# Patient Record
Sex: Female | Born: 1990 | Race: Black or African American | Hispanic: No | Marital: Single | Smoking: Current some day smoker
Health system: Southern US, Community
[De-identification: ages and names within clinical notes are randomized; demographics above are authoritative.]

## PROBLEM LIST (undated history)

## (undated) DIAGNOSIS — G43909 Migraine, unspecified, not intractable, without status migrainosus: Secondary | ICD-10-CM

## (undated) DIAGNOSIS — J45909 Unspecified asthma, uncomplicated: Secondary | ICD-10-CM

## (undated) DIAGNOSIS — D649 Anemia, unspecified: Secondary | ICD-10-CM

## (undated) DIAGNOSIS — E669 Obesity, unspecified: Secondary | ICD-10-CM

## (undated) DIAGNOSIS — J42 Unspecified chronic bronchitis: Secondary | ICD-10-CM

## (undated) DIAGNOSIS — S62309A Unspecified fracture of unspecified metacarpal bone, initial encounter for closed fracture: Secondary | ICD-10-CM

## (undated) DIAGNOSIS — R569 Unspecified convulsions: Secondary | ICD-10-CM

---
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
## 2012-01-01 ENCOUNTER — Telehealth (INDEPENDENT_AMBULATORY_CARE_PROVIDER_SITE_OTHER): Payer: Self-pay | Admitting: General Surgery

## 2012-01-01 NOTE — Telephone Encounter (Signed)
Received call from Jola Babinski, at [facility], to schedule this pt for Urgent clinic.  Gave appt for next afternoon for Rt abscess vs cyst, possible I&D.  Jola Babinski called back to report the mother of this pt cannot come to any appt before next week.  Called the mother to impress importance of coming to appt tomorrow because of pain and infection for her daughter.  Mother explained she has to arrange transportation through her Medicaid, and they require 5 days advance notice.  Urgent office appt canceled and pt was rescheduled as a new patient appt with Dr. Donell Beers.  Mother aware of appt date, time and location.  Also advised mother to take pt to ER if she develops fever > 101 F or severe pain.  Mother understands all.

## 2012-01-02 ENCOUNTER — Encounter (INDEPENDENT_AMBULATORY_CARE_PROVIDER_SITE_OTHER): Payer: Self-pay | Admitting: Surgery

## 2012-01-08 ENCOUNTER — Ambulatory Visit (INDEPENDENT_AMBULATORY_CARE_PROVIDER_SITE_OTHER): Payer: Self-pay | Admitting: General Surgery

## 2012-01-10 ENCOUNTER — Ambulatory Visit (INDEPENDENT_AMBULATORY_CARE_PROVIDER_SITE_OTHER): Payer: Self-pay | Admitting: General Surgery

## 2012-01-10 ENCOUNTER — Ambulatory Visit (INDEPENDENT_AMBULATORY_CARE_PROVIDER_SITE_OTHER): Payer: Medicaid Other | Admitting: General Surgery

## 2012-01-10 ENCOUNTER — Encounter (INDEPENDENT_AMBULATORY_CARE_PROVIDER_SITE_OTHER): Payer: Self-pay | Admitting: General Surgery

## 2012-01-10 VITALS — BP 102/60 | HR 100 | Temp 98.4°F | Resp 20 | Ht 66.0 in | Wt 186.6 lb

## 2012-01-10 DIAGNOSIS — L720 Epidermal cyst: Secondary | ICD-10-CM | POA: Insufficient documentation

## 2012-01-10 DIAGNOSIS — L723 Sebaceous cyst: Secondary | ICD-10-CM

## 2012-01-10 HISTORY — DX: Epidermal cyst: L72.0

## 2012-01-10 NOTE — Assessment & Plan Note (Addendum)
Patient appears to have an epidermal inclusion cyst of the chest wall at the medial right inframammary fold.  She has only noted this for around a month.  I will see her back in 2 months.  If it is still bothering her, we will schedule to excise this. I advised her that if it does get red or is increasing in symptoms, that she can call and leave a message and we will find a time earlier for excision. This is only around 8 mm and is amenable to excision in the office.

## 2012-01-10 NOTE — Progress Notes (Signed)
Chief complaint:  Breast mass vs cyst on right  HISTORY: Patient is a 21 year old female who has noticed a small lump in her right breast for around a month. She states that it has gotten bigger and smaller multiple times. She has not correlated this with her menstrual cycle. She does have regular periods.  The area is sometimes sore especially if it is bumped. However, she has not had enough discomfort from it to require analgesics. She has not noticed any skin redness or drainage. She denies fevers and chills.She does have a family history of breast cancer in 2 maternal great aunts.  History reviewed. No pertinent past medical history.  History reviewed. No pertinent past surgical history.  No current outpatient prescriptions on file.     Allergies  Allergen Reactions  . Latex Hives     Family History  Problem Relation Age of Onset  . Cancer Maternal Aunt     breast  . Cancer Paternal Aunt     lung  . Cancer Maternal Aunt     breast  . Cancer Maternal Aunt     ovarian  . Cancer Maternal Aunt     ovarian     History   Social History  . Marital Status: Single    Spouse Name: N/A    Number of Children: N/A  . Years of Education: N/A   Social History Main Topics  . Smoking status: Current Some Day Smoker  . Smokeless tobacco: None  . Alcohol Use: Yes  . Drug Use: No  . Sexually Active:     REVIEW OF SYSTEMS - PERTINENT POSITIVES ONLY: 12 point review of systems negative other than HPI and PMH   EXAM: Filed Vitals:   01/10/12 0940  BP: 102/60  Pulse: 100  Temp: 98.4 F (36.9 C)  Resp: 20    Gen:  No acute distress.  Well nourished and well groomed.   Neurological: Alert and oriented to person, place, and time. Coordination normal.  Head: Normocephalic and atraumatic.  Eyes: Conjunctivae are normal. Pupils are equal, round, and reactive to light. No scleral icterus.  Neck: Normal range of motion. Neck supple. No tracheal deviation or thyromegaly present.   Cardiovascular: Normal rate, regular rhythm and intact distal pulses.   Respiratory: Effort normal.  No respiratory distress.  Breasts:  Ptotic breasts are symmetric bilaterally.  There is an 8 mm firm mobile intradermal lesion at the very medial aspect of the right inframammary fold on the right lateral sternal border.  This is slightly tender.  There is no erythema, but there is a small amount of hyperpigmentation nearby.  There is a scar around 1 cm lateral to the area.   GI: Soft. Bowel sounds are normal. The abdomen is soft and nontender.  There is no rebound and no guarding.  Musculoskeletal: Normal range of motion. Extremities are nontender.  Lymphadenopathy: No cervical, preauricular, postauricular or axillary adenopathy is present Skin: Skin is warm and dry. No rash noted. No diaphoresis. No erythema. No pallor. No clubbing, cyanosis, or edema.   Psychiatric: Normal mood and affect. Behavior is normal. Judgment and thought content normal.     ASSESSMENT AND PLAN: Epidermal inclusion cyst Patient appears to have an epidermal inclusion cyst of the chest wall at the medial right inframammary fold.  She has only noted this for around a month.  I will see her back in 2 months.  If it is still bothering her, we will schedule to excise this. I advised  her that if it does get red or is increasing in symptoms, that she can call and leave a message and we will find a time earlier for excision. This is only around 8 mm and is amenable to excision in the office.      Maudry Diego MD Surgical Oncology, General and Endocrine Surgery [facility]      Visit Diagnoses: 1. Epidermal inclusion cyst     Primary Care Physician: Cain Sieve, MD

## 2012-01-10 NOTE — Patient Instructions (Signed)
Follow up in 2 months.  If you decide earlier that you want the epidermal inclusion cyst before then, call and speak with [facility].

## 2012-02-03 ENCOUNTER — Ambulatory Visit (INDEPENDENT_AMBULATORY_CARE_PROVIDER_SITE_OTHER): Payer: Medicaid Other | Admitting: General Surgery

## 2012-02-03 DIAGNOSIS — R222 Localized swelling, mass and lump, trunk: Secondary | ICD-10-CM

## 2012-02-03 NOTE — Patient Instructions (Signed)
No shower for 48 hours.  After showering, may remove outer dressing.  Follow up in 3 weeks.  No bath or swim for 2 weeks.

## 2012-02-04 NOTE — Progress Notes (Signed)
PRE-OPERATIVE DIAGNOSIS: right chest wall mass  POST-OPERATIVE DIAGNOSIS:  Same  PROCEDURE:  Procedure(s): Excision of right chest wall mass  SURGEON:  Surgeon(s): Almond Lint, MD  ANESTHESIA:   local  DRAINS: none   LOCAL MEDICATIONS USED:  XYLOCAINE   SPECIMEN:  Source of Specimen:  right chest wall mass  DISPOSITION OF SPECIMEN:  PATHOLOGY  PLAN OF CARE: Home  Findings:  Small cyst c/w epidermal inclusion cyst.  PROCEDURE:  Pt identified in room.  Mass of concern was confirmed with pt on right inframammary fold medially. Informed consent was obtained.  The area was prepped and draped sterilely.  The skin was anesthetized with 1% lidocaine with epinephrine.  A 1.5 cm incision was made with a #15 blade over the mass.  Sharp dissection was used to dissect the mass away from the skin.  Pressure was held for hemostasis.  Once this was obtained, the incision was closed with 3-0 vicryl deep dermal interrupted sutures and 4-0 monocryl running subcuticular suture.  The wound was cleaned, dried, and dressed with steristrips, gauze, and Tegaderm.  The patient tolerated the procedure well.

## 2012-02-25 ENCOUNTER — Ambulatory Visit (INDEPENDENT_AMBULATORY_CARE_PROVIDER_SITE_OTHER): Payer: Medicaid Other | Admitting: General Surgery

## 2012-02-25 VITALS — BP 108/60 | HR 72 | Temp 98.0°F | Resp 18 | Ht 66.0 in | Wt 190.8 lb

## 2012-02-25 DIAGNOSIS — L723 Sebaceous cyst: Secondary | ICD-10-CM

## 2012-02-25 DIAGNOSIS — L720 Epidermal cyst: Secondary | ICD-10-CM

## 2012-02-25 NOTE — Progress Notes (Signed)
HISTORY: Patient is around 3 weeks status post excision of epidermal inclusion cyst in the right medial inframammary fold. She did have pain for around a week but this has resolved. She denies other drainage or issues.    EXAM: General:  Alert and oriented.  Flat affect Incision:  Firm scar.  No fluctuance.  No erythema.     PATHOLOGY: Soft tissue mass, simple excision, chest wall, right - CHRONICALLY INFLAMED SUBCUTANEOUS TISSUE. - NO DYSPLASIA OR MALIGNANCY IDENTIFIED.   ASSESSMENT AND PLAN:   Epidermal inclusion cyst Pathology benign  Follow up as needed.  No evidence of surgical complications.        Maudry Diego, MD Surgical Oncology, General & Endocrine Surgery [facility]  Cain Sieve, MD Cain Sieve*

## 2012-02-25 NOTE — Assessment & Plan Note (Signed)
Pathology benign  Follow up as needed.  No evidence of surgical complications.

## 2012-02-25 NOTE — Patient Instructions (Signed)
Follow up as needed.  Call if issues occur.

## 2012-03-13 ENCOUNTER — Encounter (INDEPENDENT_AMBULATORY_CARE_PROVIDER_SITE_OTHER): Payer: Medicaid Other | Admitting: General Surgery

## 2012-04-04 ENCOUNTER — Other Ambulatory Visit: Payer: Self-pay

## 2012-09-10 ENCOUNTER — Encounter (INDEPENDENT_AMBULATORY_CARE_PROVIDER_SITE_OTHER): Payer: Self-pay | Admitting: General Surgery

## 2012-09-10 ENCOUNTER — Ambulatory Visit (INDEPENDENT_AMBULATORY_CARE_PROVIDER_SITE_OTHER): Payer: Medicaid Other | Admitting: General Surgery

## 2012-09-10 VITALS — BP 100/77 | HR 62 | Temp 98.1°F | Resp 12 | Ht 67.0 in | Wt 172.8 lb

## 2012-09-10 DIAGNOSIS — N61 Mastitis without abscess: Secondary | ICD-10-CM

## 2012-09-10 DIAGNOSIS — N611 Abscess of the breast and nipple: Secondary | ICD-10-CM

## 2012-09-10 NOTE — Progress Notes (Signed)
Chief Complaint  Patient presents with  . Wound Infection    right breast    HISTORY:  Nicole Rose is a 22 y.o. female who presents to clinic with a R medial breast mass.  This is in the same place as an epidermal inclusion cyst that was removed by Dr Donell Beers in Dec.  She states that it has been increasing in size for the past 2 weeks.  She denies erythema, fevers or chills.  She also states that she has had some spontaneous nipple inversion over the past few weeks.    History reviewed. No pertinent past medical history.     History reviewed. past surgical history: excision of inclusion cyst, R breast 12/13   No current outpatient prescriptions on file.   No current facility-administered medications for this visit.     Allergies  Allergen Reactions  . Latex Hives      Family History  Problem Relation Age of Onset  . Cancer Maternal Aunt     breast  . Cancer Paternal Aunt     lung  . Cancer Maternal Aunt     breast  . Cancer Maternal Aunt     ovarian  . Cancer Maternal Aunt     ovarian      History   Social History  . Marital Status: Single    Spouse Name: N/A    Number of Children: N/A  . Years of Education: N/A   Social History Main Topics  . Smoking status: Current Some Day Smoker -- 0.50 packs/day    Types: Cigarettes  . Smokeless tobacco: None  . Alcohol Use: Yes  . Drug Use: No  . Sexually Active: None   Other Topics Concern  . None   Social History Narrative  . None       REVIEW OF SYSTEMS - PERTINENT POSITIVES ONLY: Review of Systems - General ROS: negative for - chills or fever Respiratory ROS: no cough, shortness of breath, or wheezing Cardiovascular ROS: no chest pain or dyspnea on exertion Gastrointestinal ROS: no abdominal pain, change in bowel habits, or black or bloody stools  EXAM: Filed Vitals:   09/10/12 1536  BP: 100/77  Pulse: 62  Temp: 98.1 F (36.7 C)  Resp: 12    General appearance: alert and cooperative Resp:  clear to auscultation bilaterally Cardio: regular rate and rhythm GI: soft, non-tender; bowel sounds normal; no masses,  no organomegaly R breast: mass noted subcutaneously, area of fluctuance palpated above this  Procedure: I&D Surgeon: Maisie Fus Assistant: Christella Scheuermann After the risks and benefits were explained, verbal consent was obtained for above procedure  Anesthesia: lidocaine subcutaneously Diagnosis: R breast mass, abscess Procedure: The patient was lain supine and the area was cleaned with chloroprep.  Subcutaneous lidocaine was infused around the mass. An incision was made around the fluctuant portion of the mass using an 11 blade scalpel. Purulence was expressed. Direct pressure was applied for hemostasis. A sterile dressing was applied over this. Patient tolerated the procedure well   ASSESSMENT AND PLAN: Nicole Rose is a 22 y.o. F who is s/p removal of an inclusion cyst.  She has developed an abscess in this same area.  This was opened and allowed to drain.  I have asked her to f/u with Dr Donell Beers in a few weeks to see her once the inflammation has receded.      Vanita Panda, MD Colon and Rectal Surgery / General Surgery [facility]  Visit Diagnoses: 1. Breast abscess of female     Primary Care Physician: Cain Sieve, MD

## 2012-09-10 NOTE — Patient Instructions (Signed)
Remove dressing tomorrow.  Clean with soap and water.  Keep area covered until it stops draining then ok to leave open to air.

## 2012-09-22 ENCOUNTER — Encounter (HOSPITAL_COMMUNITY): Payer: Self-pay | Admitting: Pharmacy Technician

## 2012-09-22 ENCOUNTER — Encounter (INDEPENDENT_AMBULATORY_CARE_PROVIDER_SITE_OTHER): Payer: Self-pay | Admitting: General Surgery

## 2012-09-22 ENCOUNTER — Ambulatory Visit (INDEPENDENT_AMBULATORY_CARE_PROVIDER_SITE_OTHER): Payer: Medicaid Other | Admitting: General Surgery

## 2012-09-22 VITALS — BP 118/68 | HR 64 | Resp 14 | Ht 67.0 in | Wt 174.4 lb

## 2012-09-22 DIAGNOSIS — N61 Mastitis without abscess: Secondary | ICD-10-CM

## 2012-09-22 DIAGNOSIS — N611 Abscess of the breast and nipple: Secondary | ICD-10-CM | POA: Insufficient documentation

## 2012-09-22 MED ORDER — DOXYCYCLINE HYCLATE 100 MG PO TABS: 100.0000 mg | ORAL_TABLET | Freq: Two times a day (BID) | ORAL | Status: DC

## 2012-09-22 NOTE — Assessment & Plan Note (Signed)
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   Will schedule removal under anesthesia.   Hopefully we can get this area healed up so it stops hurting and draining. I will place her on doxycycline until the time of surgery to minimize risk of post operative infection.

## 2012-09-22 NOTE — Patient Instructions (Signed)
Do warm compresses and antibiotic ointment.   Take antibiotics for the next week.    Main risks are bleeding, infection, damage to adjacent structures, wound issues.

## 2012-09-22 NOTE — Progress Notes (Signed)
Chief Complaint  Patient presents with  . Routine Post Op    po br cyst    HISTORY:  Nicole Rose is a 22 y.o. female  status post excision of inclusion cyst of the right breast in December of last year. She saw Dr. Thomas several weeks ago with a recurrent infection.  She underwent I&D. She states that it did well for several days but it started to hurt again and get swollen again. She denies fevers and chills. She has not noticed any drainage.  History reviewed. No pertinent past medical history.     History reviewed. past surgical history: excision of inclusion cyst, R breast 12/13   Current Outpatient Prescriptions  Medication Sig Dispense Refill  . doxycycline (VIBRA-TABS) 100 MG tablet Take 1 tablet (100 mg total) by mouth 2 (two) times daily.  14 tablet  2  . ibuprofen (ADVIL,MOTRIN) 600 MG tablet Take 600 mg by mouth every 6 (six) hours as needed for pain. For pain       No current facility-administered medications for this visit.     Allergies  Allergen Reactions  . Latex Hives      Family History  Problem Relation Age of Onset  . Cancer Maternal Aunt     breast  . Cancer Paternal Aunt     lung  . Cancer Maternal Aunt     breast  . Cancer Maternal Aunt     ovarian  . Cancer Maternal Aunt     ovarian      History   Social History  . Marital Status: Single    Spouse Name: N/A    Number of Children: N/A  . Years of Education: N/A   Social History Main Topics  . Smoking status: Current Some Day Smoker -- 0.50 packs/day    Types: Cigarettes  . Smokeless tobacco: None  . Alcohol Use: Yes  . Drug Use: No  . Sexually Active: None    REVIEW OF SYSTEMS - PERTINENT POSITIVES ONLY: Review of Systems - otherwise negative x 11.    EXAM: Filed Vitals:   09/22/12 1554  BP: 118/68  Pulse: 64  Resp: 14    General appearance: alert and cooperative GI: soft, non-tender; bowel sounds normal; no masses,  no organomegaly R breast: area of thickening right  medial breast.  Small amount of purulent drainage expressed at inframammary fold.    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   Will schedule removal under anesthesia.   Hopefully we can get this area healed up so it stops hurting and draining. I will place her on doxycycline until the time of surgery to minimize risk of post operative infection.          Visit Diagnoses: 1. Chronic abscess of breast     Primary Care Physician: PERRY, MARTHA FAIRBANKS, MD     

## 2012-09-25 ENCOUNTER — Encounter (HOSPITAL_COMMUNITY)
Admission: RE | Admit: 2012-09-25 | Discharge: 2012-09-25 | Disposition: A | Discharge status: Home / Self Care | Payer: Medicaid Other | Source: Ambulatory Visit | Attending: General Surgery | Admitting: General Surgery

## 2012-09-25 ENCOUNTER — Encounter (HOSPITAL_COMMUNITY): Payer: Self-pay

## 2012-09-25 LAB — CBC
Platelets: 255 10*3/uL (ref 150–400)
RDW: 14.5 % (ref 11.5–15.5)
WBC: 10.1 10*3/uL (ref 4.0–10.5)

## 2012-09-25 LAB — HCG, SERUM, QUALITATIVE: Preg, Serum: NEGATIVE

## 2012-09-25 NOTE — Pre-Procedure Instructions (Signed)
PORCIA MORGANTI  09/25/2012   Your procedure is scheduled on:  Monday September 28, 2012  Report to [facility] at 0530 AM.  Call this number if you have problems the morning of surgery: [phone]   Remember:   Do not eat food or drink liquids after midnight.   Take these medicines the morning of surgery with A SIP OF WATER: None.              Stop Ibuprofen (ADVIL, MOTRIN)  7 days prior surgery.   Do not wear jewelry, make-up or nail polish.  Do not wear lotions, powders, or perfumes. You may wear deodorant.  Do not shave 48 hours prior to surgery.   Do not bring valuables to the hospital.  [facility] is not responsible for any belongings or valuables.  Contacts, dentures or bridgework may not be worn into surgery.  Leave suitcase in the car. After surgery it may be brought to your room.  For patients admitted to the hospital, checkout time is 11:00 AM the day of discharge.   Patients discharged the day of surgery will not be allowed to drive home.  Name and phone number of your driver:   Special Instructions: Shower using CHG 2 nights before surgery and the night before surgery.  If you shower the day of surgery use CHG.  Use special wash - you have one bottle of CHG for all showers.  You should use approximately 1/3 of the bottle for each shower.   Please read over the following fact sheets that you were given: Pain Booklet, Blood Transfusion Information and Surgical Site Infection Prevention

## 2012-09-27 MED ORDER — CEFAZOLIN SODIUM-DEXTROSE 2-3 GM-% IV SOLR
2.0000 g | INTRAVENOUS | Status: AC
  Administered 2012-09-28: 2 g via INTRAVENOUS
  Filled 2012-09-27: qty 50

## 2012-09-28 ENCOUNTER — Ambulatory Visit (HOSPITAL_COMMUNITY)
Admission: RE | Admit: 2012-09-28 | Discharge: 2012-09-28 | Disposition: A | Discharge status: Home / Self Care | Payer: Medicaid Other | Source: Ambulatory Visit | Attending: General Surgery | Admitting: General Surgery

## 2012-09-28 ENCOUNTER — Encounter (HOSPITAL_COMMUNITY)
Admission: RE | Disposition: A | Discharge status: Home / Self Care | Payer: Self-pay | Source: Ambulatory Visit | Attending: General Surgery

## 2012-09-28 ENCOUNTER — Ambulatory Visit (HOSPITAL_COMMUNITY): Payer: Medicaid Other | Admitting: Anesthesiology

## 2012-09-28 ENCOUNTER — Encounter (HOSPITAL_COMMUNITY): Payer: Self-pay | Admitting: Anesthesiology

## 2012-09-28 ENCOUNTER — Encounter (HOSPITAL_COMMUNITY): Payer: Self-pay | Admitting: *Deleted

## 2012-09-28 DIAGNOSIS — N6459 Other signs and symptoms in breast: Secondary | ICD-10-CM | POA: Insufficient documentation

## 2012-09-28 DIAGNOSIS — N61 Mastitis without abscess: Secondary | ICD-10-CM | POA: Insufficient documentation

## 2012-09-28 HISTORY — PX: BREAST BIOPSY: SHX20

## 2012-09-28 SURGERY — BREAST BIOPSY
Anesthesia: General | Site: Breast | Laterality: Right | Wound class: Contaminated

## 2012-09-28 MED ORDER — BUPIVACAINE-EPINEPHRINE PF 0.25-1:200000 % IJ SOLN
INTRAMUSCULAR | Status: AC
  Filled 2012-09-28: qty 30

## 2012-09-28 MED ORDER — HYDROMORPHONE HCL PF 1 MG/ML IJ SOLN: 0.2500 mg | INTRAMUSCULAR | Status: DC | PRN

## 2012-09-28 MED ORDER — ONDANSETRON HCL 4 MG/2ML IJ SOLN
4.0000 mg | Freq: Four times a day (QID) | INTRAMUSCULAR | Status: DC | PRN
  Filled 2012-09-28: qty 2

## 2012-09-28 MED ORDER — FENTANYL CITRATE 0.05 MG/ML IJ SOLN
INTRAMUSCULAR | Status: DC | PRN
  Administered 2012-09-28 (×3): 50 ug via INTRAVENOUS
  Administered 2012-09-28: 100 ug via INTRAVENOUS

## 2012-09-28 MED ORDER — OXYCODONE HCL 5 MG PO TABS: 5.0000 mg | ORAL_TABLET | ORAL | Status: DC | PRN

## 2012-09-28 MED ORDER — LACTATED RINGERS IV SOLN
INTRAVENOUS | Status: DC | PRN
  Administered 2012-09-28: 07:00:00 via INTRAVENOUS

## 2012-09-28 MED ORDER — OXYCODONE-ACETAMINOPHEN 5-325 MG PO TABS: 1.0000 | ORAL_TABLET | ORAL | Status: DC | PRN

## 2012-09-28 MED ORDER — BUPIVACAINE-EPINEPHRINE PF 0.25-1:200000 % IJ SOLN
INTRAMUSCULAR | Status: DC | PRN
  Administered 2012-09-28: 30 mL

## 2012-09-28 MED ORDER — MIDAZOLAM HCL 5 MG/5ML IJ SOLN
INTRAMUSCULAR | Status: DC | PRN
  Administered 2012-09-28: 2 mg via INTRAVENOUS

## 2012-09-28 MED ORDER — ONDANSETRON HCL 4 MG/2ML IJ SOLN
INTRAMUSCULAR | Status: DC | PRN
  Administered 2012-09-28: 4 mg via INTRAVENOUS

## 2012-09-28 MED ORDER — DEXAMETHASONE SODIUM PHOSPHATE 4 MG/ML IJ SOLN
INTRAMUSCULAR | Status: DC | PRN
  Administered 2012-09-28: 8 mg via INTRAVENOUS

## 2012-09-28 MED ORDER — ONDANSETRON HCL 4 MG/2ML IJ SOLN: 4.0000 mg | Freq: Once | INTRAMUSCULAR | Status: DC | PRN

## 2012-09-28 MED ORDER — LIDOCAINE HCL (CARDIAC) 10 MG/ML IV SOLN
INTRAVENOUS | Status: DC | PRN
  Administered 2012-09-28: 70 mg via INTRAVENOUS

## 2012-09-28 MED ORDER — PROPOFOL 10 MG/ML IV BOLUS
INTRAVENOUS | Status: DC | PRN
  Administered 2012-09-28: 160 mg via INTRAVENOUS

## 2012-09-28 MED ORDER — 0.9 % SODIUM CHLORIDE (POUR BTL) OPTIME
TOPICAL | Status: DC | PRN
  Administered 2012-09-28: 1000 mL

## 2012-09-28 MED ORDER — LIDOCAINE HCL (PF) 1 % IJ SOLN
INTRAMUSCULAR | Status: AC
  Filled 2012-09-28: qty 30

## 2012-09-28 SURGICAL SUPPLY — 46 items
BENZOIN TINCTURE PRP APPL 2/3 (GAUZE/BANDAGES/DRESSINGS) ×2 IMPLANT
BINDER BREAST LRG (GAUZE/BANDAGES/DRESSINGS) IMPLANT
BINDER BREAST MEDIUM (GAUZE/BANDAGES/DRESSINGS) ×2 IMPLANT
BINDER BREAST XLRG (GAUZE/BANDAGES/DRESSINGS) IMPLANT
BLADE SURG 15 STRL LF DISP TIS (BLADE) ×1 IMPLANT
BLADE SURG 15 STRL SS (BLADE) ×1
CANISTER SUCTION 2500CC (MISCELLANEOUS) ×2 IMPLANT
CHLORAPREP W/TINT 26ML (MISCELLANEOUS) ×2 IMPLANT
CLOTH BEACON ORANGE TIMEOUT ST (SAFETY) ×2 IMPLANT
CONT SPEC 4OZ CLIKSEAL STRL BL (MISCELLANEOUS) ×2 IMPLANT
COVER SURGICAL LIGHT HANDLE (MISCELLANEOUS) ×2 IMPLANT
DECANTER SPIKE VIAL GLASS SM (MISCELLANEOUS) IMPLANT
DEVICE DUBIN SPECIMEN MAMMOGRA (MISCELLANEOUS) IMPLANT
DRAPE PED LAPAROTOMY (DRAPES) ×2 IMPLANT
DRAPE UTILITY 15X26 W/TAPE STR (DRAPE) ×4 IMPLANT
ELECT CAUTERY BLADE 6.4 (BLADE) ×2 IMPLANT
ELECT REM PT RETURN 9FT ADLT (ELECTROSURGICAL) ×2
ELECTRODE REM PT RTRN 9FT ADLT (ELECTROSURGICAL) ×1 IMPLANT
GLOVE BIO SURGEON STRL SZ 6 (GLOVE) IMPLANT
GLOVE BIOGEL PI IND STRL 6.5 (GLOVE) ×1 IMPLANT
GLOVE BIOGEL PI INDICATOR 6.5 (GLOVE) ×1
GLOVE SURG SS PI 6.0 STRL IVOR (GLOVE) ×4 IMPLANT
GOWN PREVENTION PLUS XXLARGE (GOWN DISPOSABLE) ×2 IMPLANT
GOWN STRL NON-REIN LRG LVL3 (GOWN DISPOSABLE) ×4 IMPLANT
KIT BASIN OR (CUSTOM PROCEDURE TRAY) ×2 IMPLANT
KIT MARKER MARGIN INK (KITS) IMPLANT
KIT ROOM TURNOVER OR (KITS) ×2 IMPLANT
NEEDLE HYPO 25GX1X1/2 BEV (NEEDLE) ×2 IMPLANT
NS IRRIG 1000ML POUR BTL (IV SOLUTION) ×2 IMPLANT
PACK SURGICAL SETUP 50X90 (CUSTOM PROCEDURE TRAY) ×2 IMPLANT
PAD ARMBOARD 7.5X6 YLW CONV (MISCELLANEOUS) ×4 IMPLANT
PENCIL BUTTON HOLSTER BLD 10FT (ELECTRODE) ×2 IMPLANT
SPONGE GAUZE 4X4 12PLY (GAUZE/BANDAGES/DRESSINGS) ×2 IMPLANT
SPONGE LAP 18X18 X RAY DECT (DISPOSABLE) ×2 IMPLANT
STRIP CLOSURE SKIN 1/2X4 (GAUZE/BANDAGES/DRESSINGS) ×2 IMPLANT
SUT MON AB 4-0 PC3 18 (SUTURE) ×2 IMPLANT
SUT SILK 2 0 FS (SUTURE) IMPLANT
SUT VIC AB 3-0 SH 27 (SUTURE) ×3
SUT VIC AB 3-0 SH 27X BRD (SUTURE) ×3 IMPLANT
SYR BULB 3OZ (MISCELLANEOUS) ×2 IMPLANT
SYR CONTROL 10ML LL (SYRINGE) ×2 IMPLANT
TOWEL OR 17X24 6PK STRL BLUE (TOWEL DISPOSABLE) ×2 IMPLANT
TOWEL OR 17X26 10 PK STRL BLUE (TOWEL DISPOSABLE) ×2 IMPLANT
TUBE CONNECTING 12X1/4 (SUCTIONS) ×2 IMPLANT
WATER STERILE IRR 1000ML POUR (IV SOLUTION) IMPLANT
YANKAUER SUCT BULB TIP NO VENT (SUCTIONS) ×2 IMPLANT

## 2012-09-28 NOTE — Anesthesia Preprocedure Evaluation (Signed)
Anesthesia Evaluation  Patient identified by MRN, date of birth, ID band Patient awake    Reviewed: Allergy & Precautions, H&P , NPO status , Patient's Chart, lab work & pertinent test results  Airway       Dental   Pulmonary          Cardiovascular     Neuro/Psych  Headaches,    GI/Hepatic   Endo/Other    Renal/GU      Musculoskeletal   Abdominal   Peds  Hematology   Anesthesia Other Findings   Reproductive/Obstetrics                           Anesthesia Physical Anesthesia Plan  ASA: I  Anesthesia Plan: General   Post-op Pain Management:    Induction: Intravenous  Airway Management Planned: LMA and Oral ETT  Additional Equipment:   Intra-op Plan:   Post-operative Plan: Extubation in OR  Informed Consent:   Plan Discussed with: CRNA, Anesthesiologist and Surgeon  Anesthesia Plan Comments:         Anesthesia Quick Evaluation

## 2012-09-28 NOTE — H&P (View-Only) (Signed)
Chief Complaint  Patient presents with  . Routine Post Op    po br cyst    HISTORY:  Nicole Rose is a 22 y.o. female  status post excision of inclusion cyst of the right breast in December of last year. She saw Dr. Maisie Fus several weeks ago with a recurrent infection.  She underwent I&D. She states that it did well for several days but it started to hurt again and get swollen again. She denies fevers and chills. She has not noticed any drainage.  History reviewed. No pertinent past medical history.     History reviewed. past surgical history: excision of inclusion cyst, R breast 12/13   Current Outpatient Prescriptions  Medication Sig Dispense Refill  . doxycycline (VIBRA-TABS) 100 MG tablet Take 1 tablet (100 mg total) by mouth 2 (two) times daily.  14 tablet  2  . ibuprofen (ADVIL,MOTRIN) 600 MG tablet Take 600 mg by mouth every 6 (six) hours as needed for pain. For pain       No current facility-administered medications for this visit.     Allergies  Allergen Reactions  . Latex Hives      Family History  Problem Relation Age of Onset  . Cancer Maternal Aunt     breast  . Cancer Paternal Aunt     lung  . Cancer Maternal Aunt     breast  . Cancer Maternal Aunt     ovarian  . Cancer Maternal Aunt     ovarian      History   Social History  . Marital Status: Single    Spouse Name: N/A    Number of Children: N/A  . Years of Education: N/A   Social History Main Topics  . Smoking status: Current Some Day Smoker -- 0.50 packs/day    Types: Cigarettes  . Smokeless tobacco: None  . Alcohol Use: Yes  . Drug Use: No  . Sexually Active: None    REVIEW OF SYSTEMS - PERTINENT POSITIVES ONLY: Review of Systems - otherwise negative x 11.    EXAM: Filed Vitals:   09/22/12 1554  BP: 118/68  Pulse: 64  Resp: 14    General appearance: alert and cooperative GI: soft, non-tender; bowel sounds normal; no masses,  no organomegaly R breast: area of thickening right  medial breast.  Small amount of purulent drainage expressed at inframammary fold.    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   Will schedule removal under anesthesia.   Hopefully we can get this area healed up so it stops hurting and draining. I will place her on doxycycline until the time of surgery to minimize risk of post operative infection.          Visit Diagnoses: 1. Chronic abscess of breast     Primary Care Physician: Cain Sieve, MD

## 2012-09-28 NOTE — Interval H&P Note (Signed)
History and Physical Interval Note:  09/28/2012 7:23 AM  Nicole Rose  has presented today for surgery, with the diagnosis of chronic right breast abscess  The various methods of treatment have been discussed with the patient and family. After consideration of risks, benefits and other options for treatment, the patient has consented to  Procedure(s): EXCISION OF CHRONIC RIGHT BREAST ABSCESS (Right) as a surgical intervention .  The patient's history has been reviewed, patient examined, no change in status, stable for surgery.  I have reviewed the patient's chart and labs.  Questions were answered to the patient's satisfaction.     [name]

## 2012-09-28 NOTE — Op Note (Signed)
PRE-OPERATIVE DIAGNOSIS: chronic right breast abscess  POST-OPERATIVE DIAGNOSIS:  Same  PROCEDURE:  Procedure(s): Excision of chronic right breast abscess  SURGEON:  Surgeon(s): Almond Lint, MD  ASSISTANT:   Myrtie Soman, RNFA  ANESTHESIA:   local and general  DRAINS: none   LOCAL MEDICATIONS USED:  MARCAINE     SPECIMEN:  Source of Specimen:  chronic right breast abscess  DISPOSITION OF SPECIMEN:  PATHOLOGY  COUNTS:  YES  DICTATION: .Dragon Dictation  PLAN OF CARE: Discharge to home after PACU  PATIENT DISPOSITION:  PACU - hemodynamically stable.  FINDINGS:  Chronic induration, no active purulent drainage, pits in the inframammary fold  EBL:  min  PROCEDURE: Pt was identified in the holding area, then taken to the OR.  She was placed supine on the operating room table.  General LMA anesthesia was induced.  Her right breast and chest was prepped and draped in sterile fashion.  Timeout was performed according to the surgical safety checklist. When all was correct, we continued.  The area of chronic drainage and the sinus tracts in the inframammary fold were incorporated into one ellipse of skin along the inferiomedial aspect of the right breast.  The incision was made with a #15 blade. Cautery was used to dissect out the chronically inflamed breast tissue.  The surrounding tissue was mobilized in order to pull together the tissue better. The wound was irrigated copiously. A deep layer of 3-0 Vicryl interrupted sutures was placed to recreate the contour of the breast. The redundant skin was resected at the medial aspect of the wound.  The skin was reapproximated with interrupted 3-0 Vicryl deep dermal sutures. This incision was at the inframammary fold. The skin was then closed with 4-0 Monocryl in running subcuticular fashion.  The wound was cleaned, dried, and dressed with Steri-Strips. Marcaine with epinephrine was administered along the surrounding tissue of the wound.  The patient was then placed and a breast binder with gauze and ABDs over the incision. The patient was allowed to emerge from anesthesia and taken to the PACU in stable condition.  Needle, sponge, and instrument counts were correct x2.

## 2012-09-28 NOTE — Anesthesia Postprocedure Evaluation (Signed)
  Anesthesia Post-op Note  Patient: Nicole Rose  Procedure(s) Performed: Procedure(s): EXCISION OF CHRONIC RIGHT BREAST ABSCESS (Right)  Patient Location: PACU  Anesthesia Type:General  Level of Consciousness: awake, alert , oriented and patient cooperative  Airway and Oxygen Therapy: Patient Spontanous Breathing  Post-op Pain: mild  Post-op Assessment: Post-op Vital signs reviewed, Patient's Cardiovascular Status Stable, Respiratory Function Stable, Patent Airway, No signs of Nausea or vomiting and Pain level controlled  Post-op Vital Signs: stable  Complications: No apparent anesthesia complications

## 2012-09-28 NOTE — Transfer of Care (Signed)
Immediate Anesthesia Transfer of Care Note  Patient: Nicole Rose  Procedure(s) Performed: Procedure(s): EXCISION OF CHRONIC RIGHT BREAST ABSCESS (Right)  Patient Location: PACU  Anesthesia Type:General  Level of Consciousness: awake, alert , oriented and patient cooperative  Airway & Oxygen Therapy: Patient Spontanous Breathing  Post-op Assessment: Report given to PACU RN, Post -op Vital signs reviewed and stable and Patient moving all extremities  Post vital signs: Reviewed and stable  Complications: No apparent anesthesia complications

## 2012-09-29 ENCOUNTER — Encounter (HOSPITAL_COMMUNITY): Payer: Self-pay | Admitting: General Surgery

## 2012-09-29 ENCOUNTER — Telehealth (INDEPENDENT_AMBULATORY_CARE_PROVIDER_SITE_OTHER): Payer: Self-pay

## 2012-09-29 NOTE — Telephone Encounter (Signed)
Pt's mother immediately called back and was given pathology results.

## 2012-09-29 NOTE — Telephone Encounter (Signed)
Two failed attempts to reach patient and her mother.  Pathology results show benign specimen.

## 2012-09-29 NOTE — Progress Notes (Signed)
Quick Note:  Please let patient know that there is no evidence of cancer in breast specimen. ______

## 2012-10-09 ENCOUNTER — Encounter (INDEPENDENT_AMBULATORY_CARE_PROVIDER_SITE_OTHER): Payer: Self-pay | Admitting: General Surgery

## 2012-10-09 ENCOUNTER — Ambulatory Visit (INDEPENDENT_AMBULATORY_CARE_PROVIDER_SITE_OTHER): Payer: Medicaid Other | Admitting: General Surgery

## 2012-10-09 VITALS — BP 125/76 | HR 62 | Temp 97.4°F | Resp 14 | Ht 67.0 in | Wt 174.2 lb

## 2012-10-09 DIAGNOSIS — N611 Abscess of the breast and nipple: Secondary | ICD-10-CM

## 2012-10-09 DIAGNOSIS — N61 Mastitis without abscess: Secondary | ICD-10-CM

## 2012-10-09 NOTE — Assessment & Plan Note (Addendum)
No current evidence of wound dehiscence or infection. However, given previous infection, pt at high risk of infected surgical site.  Follow up in 2 weeks to reevaluate.

## 2012-10-09 NOTE — Progress Notes (Signed)
HISTORY: Pt is now around 2 weeks s/p excision of chronic right breast abscess.  She still is having a fair amount of discomfort.  She was concerned about the red edges of the wound.  She denies fevers or chills.    EXAM: General:  Alert and oriented.   Incision:  Ridge of scar at medial right inframammary fold.  No erythema, warmth, or drainage seen.     PATHOLOGY: Breast, excision, Right - SKIN WITH ULCERATION AND DENSE DERMAL AND SUBCUTANEOUS INFLAMMATION, CONSISTENT WITH CLINICALLY STATED ABSCESS. - THERE IS NO EVIDENCE OF MALIGNANCY.   ASSESSMENT AND PLAN:   Chronic abscess of breast No current evidence of wound dehiscence or infection. However, given previous infection, pt at high risk of infected surgical site.  Follow up in 2 weeks to reevaluate.       Maudry Diego, MD Surgical Oncology, General & Endocrine Surgery [facility]  PERRY, Bosie Clos, MD No ref. provider found

## 2012-10-09 NOTE — Patient Instructions (Signed)
Follow-up in 2 weeks

## 2012-10-23 ENCOUNTER — Ambulatory Visit (INDEPENDENT_AMBULATORY_CARE_PROVIDER_SITE_OTHER): Payer: Medicaid Other | Admitting: Pediatrics

## 2012-10-23 ENCOUNTER — Ambulatory Visit (INDEPENDENT_AMBULATORY_CARE_PROVIDER_SITE_OTHER): Payer: Medicaid Other | Admitting: Clinical

## 2012-10-23 ENCOUNTER — Other Ambulatory Visit (HOSPITAL_COMMUNITY)
Admission: RE | Admit: 2012-10-23 | Discharge: 2012-10-23 | Disposition: A | Discharge status: Home / Self Care | Payer: Medicaid Other | Source: Ambulatory Visit | Attending: Pediatrics | Admitting: Pediatrics

## 2012-10-23 ENCOUNTER — Encounter: Payer: Self-pay | Admitting: Pediatrics

## 2012-10-23 VITALS — BP 106/70 | HR 80 | Ht 66.93 in | Wt 176.2 lb

## 2012-10-23 DIAGNOSIS — Z00129 Encounter for routine child health examination without abnormal findings: Secondary | ICD-10-CM

## 2012-10-23 DIAGNOSIS — H579 Unspecified disorder of eye and adnexa: Secondary | ICD-10-CM

## 2012-10-23 DIAGNOSIS — Z0101 Encounter for examination of eyes and vision with abnormal findings: Secondary | ICD-10-CM

## 2012-10-23 DIAGNOSIS — R94120 Abnormal auditory function study: Secondary | ICD-10-CM

## 2012-10-23 DIAGNOSIS — Z68.41 Body mass index (BMI) pediatric, 5th percentile to less than 85th percentile for age: Secondary | ICD-10-CM

## 2012-10-23 DIAGNOSIS — F17200 Nicotine dependence, unspecified, uncomplicated: Secondary | ICD-10-CM

## 2012-10-23 DIAGNOSIS — R42 Dizziness and giddiness: Secondary | ICD-10-CM

## 2012-10-23 DIAGNOSIS — R69 Illness, unspecified: Secondary | ICD-10-CM

## 2012-10-23 DIAGNOSIS — Z3009 Encounter for other general counseling and advice on contraception: Secondary | ICD-10-CM

## 2012-10-23 DIAGNOSIS — F4323 Adjustment disorder with mixed anxiety and depressed mood: Secondary | ICD-10-CM

## 2012-10-23 DIAGNOSIS — G479 Sleep disorder, unspecified: Secondary | ICD-10-CM

## 2012-10-23 DIAGNOSIS — Z113 Encounter for screening for infections with a predominantly sexual mode of transmission: Secondary | ICD-10-CM | POA: Insufficient documentation

## 2012-10-23 NOTE — Progress Notes (Signed)
Referring Provider: Dr. Gwyneth Sprout & Dr. Marina Goodell Length of visit: 4:15pm-4:45pm (30 min)  PRESENTING CONCERNS:  Nicole Rose presented for her well child check.  During the visit, Nicole Rose reported feelings of sadness during the week.  Nicole Rose also reported alcohol, drug & cigarette use during the weekends.  GOALS:  Enhance positive coping skills to minimize depressive symptoms.  INTERVENTIONS:  LCSW built rapport with Nicole Rose and assessed current symptoms for depression.  LCSW also assessed her motivation to change regarding her alcohol & drug use as well as provide education on how her behaviors affect her mood.  LCSW assessed current support system & positive coping skills she is utilizing.  OUTCOME:  Nicole Rose presented to be quiet at first but did share that she does get sad during the week when she thinks of what she's "been throughMcGraw-Hill reported she did not want to talk about what she's been through.  Nicole Rose denied any suicidal ideations or thoughts of hopelessness.  Nicole Rose did report that she can talk to her mother & best friend about anything and that usually makes her feel better. [name] also reported that she listens to music, goes for a walk, or tries to distract herself when she starts thinking "too much."   [name] reported that she does think her alcohol or drug use has anything to do with her sadness and that she does it to socialize with her friends.  [name] reported she does not think her alcohol, drug or cigarette use is a problem and is not ready to change her behaviors at this time.  [name] reported no other concerns or immediate needs at this time.  PLAN:  This LCSW will be available to provide additional support & resources as needed.  [name] will follow up with PCP as appropriate.

## 2012-10-23 NOTE — Patient Instructions (Addendum)
Healthy Sleep Habits:   Teens need about 9 hours of sleep a night. Younger children need more sleep (10-11 hours a night) and adults need slightly less (7-9 hours each night). 11 Tips to Follow: 1. No caffeine after 3pm: Avoid beverages with caffeine (soda, tea, energy drinks, etc.) especially after 3pm.  2. Don't go to bed hungry: Have your evening meal at least 3 hrs. before going to sleep. It's fine to have a small bedtime snack such as a glass of milk and a few crackers but don't have a big meal.  3. Have a nightly routine before bed: Plan on "winding down" before you go to sleep. Begin relaxing about 1 hour before you go to bed. Try doing a quiet activity such as listening to calming music, reading a book or meditating.  4. Turn off the TV and ALL electronics including video games, tablets, laptops, etc. 1 hour before sleep, and keep them out of the bedroom.  5. Turn off your cell phone and all notifications (new email and text alerts) or even better, leave your phone outside your room while you sleep. Studies have shown that a part of your brain continues to respond to certain lights and sounds even while you're still asleep.  6. Make your bedroom quiet, dark and cool. If you can't control the noise, try wearing earplugs or using a fan to block out other sounds.  7. Practice relaxation techniques. Try reading a book or meditating or drain your brain by writing a list of what you need to do the next day.  8. Don't nap unless you feel sick: you'll have a better night's sleep.  9. Don't smoke, or quit if you do. Nicotine, alcohol, and marijuana can all keep you awake. Talk to your health care provider if you need help with substance use.  10. Most importantly, wake up at the same time every day (or within 1 hour of your usual wake up time) EVEN on the weekends. A regular wake up time promotes sleep hygiene and prevents sleep problems.  11. Reduce exposure to bright light in the last three  hours of the day before going to sleep.  Maintaining good sleep hygiene and having good sleep habits lower your risk of developing sleep problems. Getting better sleep can also improve your concentration and alertness. Try the simple steps in this guide. If you still have trouble getting enough rest, make an appointment with your health care provider.    Smoking Cessation Quitting smoking is important to your health and has many advantages. However, it is not always easy to quit since nicotine is a very addictive drug. Often times, people try 3 times or more before being able to quit. This document explains the best ways for you to prepare to quit smoking. Quitting takes hard work and a lot of effort, but you can do it.  Quit Line: 1800-QUIT-NOW ADVANTAGES OF QUITTING SMOKING  You will live longer, feel better, and live better.  Your body will feel the impact of quitting smoking almost immediately.  Within 20 minutes, blood pressure decreases. Your pulse returns to its normal level.  After 8 hours, carbon monoxide levels in the blood return to normal. Your oxygen level increases.  After 24 hours, the chance of having a heart attack starts to decrease. Your breath, hair, and body stop smelling like smoke.  After 48 hours, damaged nerve endings begin to recover. Your sense of taste and smell improve.  After 72 hours, the body  is virtually free of nicotine. Your bronchial tubes relax and breathing becomes easier.  After 2 to 12 weeks, lungs can hold more air. Exercise becomes easier and circulation improves.  The risk of having a heart attack, stroke, cancer, or lung disease is greatly reduced.  After 1 year, the risk of coronary heart disease is cut in half.  After 5 years, the risk of stroke falls to the same as a nonsmoker.  After 10 years, the risk of lung cancer is cut in half and the risk of other cancers decreases significantly.  After 15 years, the risk of coronary heart  disease drops, usually to the level of a nonsmoker.  If you are pregnant, quitting smoking will improve your chances of having a healthy baby.  The people you live with, especially any children, will be healthier.  You will have extra money to spend on things other than cigarettes. QUESTIONS TO THINK ABOUT BEFORE ATTEMPTING TO QUIT You may want to talk about your answers with your caregiver.  Why do you want to quit?  If you tried to quit in the past, what helped and what did not?  What will be the most difficult situations for you after you quit? How will you plan to handle them?  Who can help you through the tough times? Your family? Friends? A caregiver?  What pleasures do you get from smoking? What ways can you still get pleasure if you quit? Here are some questions to ask your caregiver:  How can you help me to be successful at quitting?  What medicine do you think would be best for me and how should I take it?  What should I do if I need more help?  What is smoking withdrawal like? How can I get information on withdrawal? GET READY  Set a quit date.  Change your environment by getting rid of all cigarettes, ashtrays, matches, and lighters in your home, car, or work. Do not let people smoke in your home.  Review your past attempts to quit. Think about what worked and what did not. GET SUPPORT AND ENCOURAGEMENT You have a better chance of being successful if you have help. You can get support in many ways.  Tell your family, friends, and co-workers that you are going to quit and need their support. Ask them not to smoke around you.  Get individual, group, or telephone counseling and support. Programs are available at [employer] and health centers. Call your local health department for information about programs in your area.  Spiritual beliefs and practices may help some smokers quit.  Download a "quit meter" on your computer to keep track of quit statistics, such  as how long you have gone without smoking, cigarettes not smoked, and money saved.  Get a self-help book about quitting smoking and staying off of tobacco. LEARN NEW SKILLS AND BEHAVIORS  Distract yourself from urges to smoke. Talk to someone, go for a walk, or occupy your time with a task.  Change your normal routine. Take a different route to work. Drink tea instead of coffee. Eat breakfast in a different place.  Reduce your stress. Take a hot bath, exercise, or read a book.  Plan something enjoyable to do every day. Reward yourself for not smoking.  Explore interactive web-based programs that specialize in helping you quit. GET MEDICINE AND USE IT CORRECTLY Medicines can help you stop smoking and decrease the urge to smoke. Combining medicine with the above behavioral methods and support  can greatly increase your chances of successfully quitting smoking.  Nicotine replacement therapy helps deliver nicotine to your body without the negative effects and risks of smoking. Nicotine replacement therapy includes nicotine gum, lozenges, inhalers, nasal sprays, and skin patches. Some may be available over-the-counter and others require a prescription.  Antidepressant medicine helps people abstain from smoking, but how this works is unknown. This medicine is available by prescription.  Nicotinic receptor partial agonist medicine simulates the effect of nicotine in your brain. This medicine is available by prescription. Ask your caregiver for advice about which medicines to use and how to use them based on your health history. Your caregiver will tell you what side effects to look out for if you choose to be on a medicine or therapy. Carefully read the information on the package. Do not use any other product containing nicotine while using a nicotine replacement product.  RELAPSE OR DIFFICULT SITUATIONS Most relapses occur within the first 3 months after quitting. Do not be discouraged if you start  smoking again. Remember, most people try several times before finally quitting. You may have symptoms of withdrawal because your body is used to nicotine. You may crave cigarettes, be irritable, feel very hungry, cough often, get headaches, or have difficulty concentrating. The withdrawal symptoms are only temporary. They are strongest when you first quit, but they will go away within 10 14 days. To reduce the chances of relapse, try to:  Avoid drinking alcohol. Drinking lowers your chances of successfully quitting.  Reduce the amount of caffeine you consume. Once you quit smoking, the amount of caffeine in your body increases and can give you symptoms, such as a rapid heartbeat, sweating, and anxiety.  Avoid smokers because they can make you want to smoke.  Do not let weight gain distract you. Many smokers will gain weight when they quit, usually less than 10 pounds. Eat a healthy diet and stay active. You can always lose the weight gained after you quit.  Find ways to improve your mood other than smoking. FOR MORE INFORMATION  www.smokefree.gov  Document Released: 01/29/2001 Document Revised: 08/06/2011 Document Reviewed: 05/16/2011 [facility] Patient Information 2014 [place], [place].

## 2012-10-23 NOTE — Progress Notes (Signed)
Routine Well-Adolescent Visit   History was provided by the patient.  Nicole Rose is a 22 y.o. female who is here for a well visit. PCP Confirmed? yes  PERRY, Bosie Clos, MD  HPI:  Nicole Rose, Nicole Rose has been doing well since having a breast abscess drained on 09/28/12.  Nicole Rose dose complain of occasional lightheadedness, often when initially standing or when walking to the store. During these episodes she reports feeling dizzy, sees things going black, then usually recovers. 2 months ago pt lost consciousness for a few seconds, but immediately recovered, and denies any palpitiations or other neurologic symptoms associated with these episodes.  She admits to poor hydration during the day, saying she "hardly drinks much and doesn't eat a lot," though her weight is up over the past 2 months. In terms of education, Nicole Rose is attending school at [employer] studying Early Childhood Education, and wants to own a good daycare. In terms of activites, she likes to walk and hang out with her friends in her free time.  In terms of diet, pt states she often skips breafast because eating early in the day makes her nauseous.  She states she gets full easily and doesn't eat large meals.  Pt admits to not eating a well balanced diet, and eats primarily fried meats, likes to snack on brownies and chips, and does not like to eat vegetables.  She does enjoy fruit. Nicole Rose does drink 2-3 glasses of milk a day.  She drinks 1-2 sodas a day, and 3 bottles of gatorade a day. Pt is voiding and stooling well.  Nicole Rose only gets 5-6 hrs a night, often interrupted.  Pt admits to 8-9 hrs of screen time a day.    In terms of social screening, when questioned alone Nicole Rose admits to alcohol use, drinking 3-4 beers a weekend with friends, smoking marijuana with friends on the weekend, usually 4-5 joints, along with about 10 cigarrettes a week, again usually in a social setting.  Nicole Rose admits to being sexually Nicole Rose, though states  that she ALWAYS uses condoms.  She says she is attracted to both sexes, but has only had vaginal intercourse and denies anal or oral sex.  She does not use anything else for contraception, and says she is not interested in anything else at this time.  She would like STI screening today though denies any symptoms.   Review of Systems:  Constitutional:   Denies fever  Vision: Denies concerns about vision  HENT: Denies concerns about hearing, snoring  Lungs:   Denies difficulty breathing  Heart:   Denies chest pain  Gastrointestinal:   Denies abdominal pain, constipation, diarrhea  Genitourinary:   Denies dysuria  Neurologic:   Denies headaches   Patient's last menstrual period was 10/07/2012. Menstrual History: flow is moderate, regular every month without intermenstrual spotting, usually lasting less than 6 days and with minimal cramping  Current Outpatient Prescriptions on File Prior to Visit  Medication Sig Dispense Refill  . ibuprofen (ADVIL,MOTRIN) 600 MG tablet Take 600 mg by mouth every 6 (six) hours as needed for pain. For pain      . oxyCODONE-acetaminophen (ROXICET) 5-325 MG per tablet Take 1-2 tablets by mouth every 4 (four) hours as needed for pain.  30 tablet  0   No current facility-administered medications on file prior to visit.    Past Medical History:  Allergies  Allergen Reactions  . Latex Hives   Past Medical History  Diagnosis Date  . Headache(784.0)  migraine    Family history:  Family History  Problem Relation Age of Onset  . Cancer Maternal Aunt     breast  . Cancer Paternal Aunt     lung  . Cancer Maternal Aunt     breast  . Cancer Maternal Aunt     ovarian  . Cancer Maternal Aunt     ovarian    Social History: Confidentiality was discussed with the patient and if applicable, with caregiver as well.  Lives with: lives at home with mother Parental relations: good Friends/Peers: has many good friends Patient reports being comfortable and  safe at school and at home, bullying: no Bullying others: no  School performance: doing well; no concerns School History: School attendance is regular.  Tobacco: 10 cigarettes a week Secondhand smoke exposure? no Drugs/EtOH: Marijuanna - 4-5 joints a week Sexually active? yes - uses condoms consistently  Last STI Screening: unknown Pregnancy Prevention: condoms  Screenings: The patient completed the Rapid Assessment for Adolescent Preventive Services screening questionnaire and the following topics were identified as risk factors and discussed:weapon use, tobacco use, marijuana use, birth control and sexuality  In addition, the following topics were discussed as part of anticipatory guidance healthy eating, exercise, seatbelt use, birth control, sexuality and screen time.  PHQ-9 completed and results listed in separate section. Suicidality was: negative  Additional Screening:  STI screening sent  The following portions of the patient's history were reviewed and updated as appropriate: allergies, current medications, past family history, past medical history, past social history, past surgical history and problem list.  Physical Exam:    Filed Vitals:   10/23/12 1505  BP: 120/70  Pulse: 88  Height: 5' 6.93" (1.7 m)  Weight: 176 lb 3.2 oz (79.924 kg)   80.9% systolic and 70.3% diastolic of BP percentile by age, sex, and height.   Assessment/Plan: Nicole Rose is a 22 y.o. Female growing and developing well, though with concern for possible orthostatic hypotension, failed hearing/vision screening, tobacco, drug, and alcohol use, mild depression symptoms, poor diet and sleep habits, and only using condoms for birth control.  1. Possible Orthostatic Hypotension: pt's vital signs not positive for orthostatic hypotension today, but pt may be more hydrated than at the times she experiences these symptoms. Counseled pt on staying adequately hydrated throughout the day.  1. STI  Screening: will send urine GC/Chlamydia and blood HIV and RPR for screening today.    2. Tobacco Cessation: Pt given QuitLine information and encouraged to quit both tobacco and marijuana use.   3. Sleep Hygiene Counseling: counseled pt on proper sleep hygiene to help with pt's poor sleep amount and quality, and give handout to the same.  4. Dietary Counseling: counseled pt on proper dietary choices and gave handout to the same.  5. Birth Control Counseling: counseled pt on various types of birth control available and went over handout detailing these options.   6. Depression Symptoms: Pt admitted to depression symptoms 1-2 days a week for most of the day.  Referred to LCSW for further evaluation today.   7. Failed Hearing/Vision Screening: will have pt repeat screenings at next visit with subsequent referrals as needed.  8. Return to Clinic: in 2 weeks for repeat hearing/vision screening and to discuss birth control.

## 2012-10-24 LAB — RPR

## 2012-10-24 LAB — HIV ANTIBODY (ROUTINE TESTING W REFLEX): HIV: NONREACTIVE

## 2012-10-30 DIAGNOSIS — R42 Dizziness and giddiness: Secondary | ICD-10-CM | POA: Insufficient documentation

## 2012-10-30 DIAGNOSIS — Z0101 Encounter for examination of eyes and vision with abnormal findings: Secondary | ICD-10-CM | POA: Insufficient documentation

## 2012-10-30 DIAGNOSIS — F4323 Adjustment disorder with mixed anxiety and depressed mood: Secondary | ICD-10-CM | POA: Insufficient documentation

## 2012-10-30 DIAGNOSIS — Z3009 Encounter for other general counseling and advice on contraception: Secondary | ICD-10-CM | POA: Insufficient documentation

## 2012-10-30 DIAGNOSIS — G479 Sleep disorder, unspecified: Secondary | ICD-10-CM | POA: Insufficient documentation

## 2012-10-30 DIAGNOSIS — R94120 Abnormal auditory function study: Secondary | ICD-10-CM | POA: Insufficient documentation

## 2012-10-30 HISTORY — DX: Encounter for examination of eyes and vision with abnormal findings: Z01.01

## 2012-10-30 HISTORY — DX: Encounter for other general counseling and advice on contraception: Z30.09

## 2012-10-30 HISTORY — DX: Dizziness and giddiness: R42

## 2012-10-30 HISTORY — DX: Abnormal auditory function study: R94.120

## 2012-10-30 NOTE — Progress Notes (Signed)
I saw and evaluated the patient, performing the key elements of the service.  I developed the management plan that is described in the resident's note, and I agree with the content. 

## 2012-11-06 ENCOUNTER — Ambulatory Visit: Payer: Medicaid Other | Admitting: Pediatrics

## 2012-12-24 ENCOUNTER — Other Ambulatory Visit: Payer: Self-pay

## 2013-10-15 ENCOUNTER — Emergency Department (HOSPITAL_COMMUNITY)
Admission: EM | Admit: 2013-10-15 | Discharge: 2013-10-15 | Disposition: A | Discharge status: Home / Self Care | Payer: Medicaid Other | Attending: Emergency Medicine | Admitting: Emergency Medicine

## 2013-10-15 ENCOUNTER — Encounter (HOSPITAL_COMMUNITY): Payer: Self-pay | Admitting: Emergency Medicine

## 2013-10-15 DIAGNOSIS — F17200 Nicotine dependence, unspecified, uncomplicated: Secondary | ICD-10-CM | POA: Insufficient documentation

## 2013-10-15 DIAGNOSIS — K5289 Other specified noninfective gastroenteritis and colitis: Secondary | ICD-10-CM | POA: Insufficient documentation

## 2013-10-15 DIAGNOSIS — K529 Noninfective gastroenteritis and colitis, unspecified: Secondary | ICD-10-CM

## 2013-10-15 DIAGNOSIS — Z3202 Encounter for pregnancy test, result negative: Secondary | ICD-10-CM | POA: Insufficient documentation

## 2013-10-15 DIAGNOSIS — Z8679 Personal history of other diseases of the circulatory system: Secondary | ICD-10-CM | POA: Insufficient documentation

## 2013-10-15 DIAGNOSIS — R112 Nausea with vomiting, unspecified: Secondary | ICD-10-CM | POA: Insufficient documentation

## 2013-10-15 DIAGNOSIS — Z91040 Latex allergy status: Secondary | ICD-10-CM | POA: Insufficient documentation

## 2013-10-15 LAB — COMPREHENSIVE METABOLIC PANEL
ALT: 12 U/L (ref 0–35)
ANION GAP: 14 (ref 5–15)
AST: 18 U/L (ref 0–37)
Albumin: 3.4 g/dL — ABNORMAL LOW (ref 3.5–5.2)
Alkaline Phosphatase: 76 U/L (ref 39–117)
BUN: 10 mg/dL (ref 6–23)
CALCIUM: 9.5 mg/dL (ref 8.4–10.5)
CO2: 21 mEq/L (ref 19–32)
CREATININE: 0.82 mg/dL (ref 0.50–1.10)
Chloride: 107 mEq/L (ref 96–112)
GFR calc non Af Amer: >90 mL/min (ref >90)
GLUCOSE: 110 mg/dL — AB (ref 70–99)
Potassium: 4.1 mEq/L (ref 3.7–5.3)
SODIUM: 142 meq/L (ref 137–147)
TOTAL PROTEIN: 7.2 g/dL (ref 6.0–8.3)
Total Bilirubin: 0.5 mg/dL (ref 0.3–1.2)

## 2013-10-15 LAB — URINE MICROSCOPIC-ADD ON

## 2013-10-15 LAB — URINALYSIS, ROUTINE W REFLEX MICROSCOPIC
Bilirubin Urine: NEGATIVE
Glucose, UA: NEGATIVE mg/dL
Hgb urine dipstick: NEGATIVE
KETONES UR: 15 mg/dL — AB
NITRITE: NEGATIVE
PROTEIN: NEGATIVE mg/dL
Specific Gravity, Urine: 1.028 (ref 1.005–1.030)
UROBILINOGEN UA: 0.2 mg/dL (ref 0.0–1.0)
pH: 5 (ref 5.0–8.0)

## 2013-10-15 LAB — CBC WITH DIFFERENTIAL/PLATELET
Basophils Absolute: 0 10*3/uL (ref 0.0–0.1)
Basophils Relative: 0 % (ref 0–1)
EOS ABS: 0.2 10*3/uL (ref 0.0–0.7)
EOS PCT: 2 % (ref 0–5)
HCT: 41.2 % (ref 36.0–46.0)
Hemoglobin: 13.4 g/dL (ref 12.0–15.0)
LYMPHS ABS: 1.4 10*3/uL (ref 0.7–4.0)
Lymphocytes Relative: 12 % (ref 12–46)
MCH: 28.8 pg (ref 26.0–34.0)
MCHC: 32.5 g/dL (ref 30.0–36.0)
MCV: 88.6 fL (ref 78.0–100.0)
MONO ABS: 0.8 10*3/uL (ref 0.1–1.0)
Monocytes Relative: 7 % (ref 3–12)
Neutro Abs: 9.7 10*3/uL — ABNORMAL HIGH (ref 1.7–7.7)
Neutrophils Relative %: 79 % — ABNORMAL HIGH (ref 43–77)
PLATELETS: 261 10*3/uL (ref 150–400)
RBC: 4.65 MIL/uL (ref 3.87–5.11)
RDW: 14.1 % (ref 11.5–15.5)
WBC: 12 10*3/uL — ABNORMAL HIGH (ref 4.0–10.5)

## 2013-10-15 LAB — POC URINE PREG, ED: PREG TEST UR: NEGATIVE

## 2013-10-15 MED ORDER — SODIUM CHLORIDE 0.9 % IV SOLN
1000.0000 mL | Freq: Once | INTRAVENOUS | Status: AC
  Administered 2013-10-15: 1000 mL via INTRAVENOUS

## 2013-10-15 MED ORDER — PROMETHAZINE HCL 25 MG PO TABS: 25.0000 mg | ORAL_TABLET | Freq: Four times a day (QID) | ORAL | Status: DC | PRN

## 2013-10-15 MED ORDER — ONDANSETRON HCL 4 MG/2ML IJ SOLN
4.0000 mg | Freq: Once | INTRAMUSCULAR | Status: AC
  Administered 2013-10-15: 4 mg via INTRAVENOUS
  Filled 2013-10-15: qty 2

## 2013-10-15 NOTE — ED Provider Notes (Signed)
CSN: [id number]     Arrival date & time 10/15/13  1213 History   First MD Initiated Contact with Patient 10/15/13 1408     Chief Complaint  Patient presents with  . Emesis  . Nausea     (Consider location/radiation/quality/duration/timing/severity/associated sxs/prior Treatment) HPI This patient presents with history of vomiting and diarrhea. Symptoms started this morning at approximately 4 AM. She reports multiple episodes of vomiting up to 6. The patient also reports she had diarrhea. The diarrhea has however at this point in time significantly decreased.denies associated abdominal pain. She reports she thought she had a stomach bug. The patient reports that her mother also was sick with vomiting and diarrheal illness. she denies fever does report some associated chills.  Past Medical History  Diagnosis Date  . Headache(784.0)     migraine   Past Surgical History  Procedure Laterality Date  . Breast surgery Right 2/14    abscess excise  . Breast biopsy Right 09/28/2012    Procedure: EXCISION OF CHRONIC RIGHT BREAST ABSCESS;  Surgeon: Almond Lint, MD;  Location: [facility] OR;  Service: General;  Laterality: Right;   Family History  Problem Relation Age of Onset  . Cancer Maternal Aunt     breast  . Cancer Paternal Aunt     lung  . Cancer Maternal Aunt     breast  . Cancer Maternal Aunt     ovarian  . Cancer Maternal Aunt     ovarian   History  Substance Use Topics  . Smoking status: Current Some Day Smoker -- 0.50 packs/day for 2 years    Types: Cigarettes  . Smokeless tobacco: Never Used  . Alcohol Use: Yes   OB History   Grav Para Term Preterm Abortions TAB SAB Ect Mult Living                 Review of Systems  Constitutional: Negative for fever and chills.  HENT: Negative for congestion and sore throat.   Respiratory: Negative for cough and shortness of breath.   Cardiovascular: Negative for chest pain and leg swelling.  Gastrointestinal: Positive for vomiting and  diarrhea. Negative for abdominal pain and abdominal distention.  Genitourinary: Negative for dysuria and difficulty urinating.  Musculoskeletal: Negative for arthralgias and myalgias.  Skin: Negative for color change and rash.  Neurological: Negative for dizziness, weakness and headaches.  Psychiatric/Behavioral: Negative for confusion and agitation.      Allergies  Latex  Home Medications   Prior to Admission medications   Not on File   BP 111/58  Pulse 90  Temp(Src) 98.3 F (36.8 C) (Oral)  Resp 18  SpO2 99%  LMP 09/24/2013 Physical Exam  Constitutional: She is oriented to person, place, and time. She appears well-developed and well-nourished.  HENT:  Head: Normocephalic and atraumatic.  Eyes: EOM are normal. Pupils are equal, round, and reactive to light.  Neck: Neck supple.  Cardiovascular: Normal rate, regular rhythm, normal heart sounds and intact distal pulses.   Pulmonary/Chest: Effort normal and breath sounds normal.  Abdominal: Soft. Bowel sounds are normal. She exhibits no distension. There is no tenderness.  Musculoskeletal: Normal range of motion. She exhibits no edema.  Neurological: She is alert and oriented to person, place, and time. She has normal strength. Coordination normal. GCS eye subscore is 4. GCS verbal subscore is 5. GCS motor subscore is 6.  Skin: Skin is warm, dry and intact.  Psychiatric: She has a normal mood and affect.    ED  Course  Procedures (including critical care time) Labs Review Labs Reviewed  CBC WITH DIFFERENTIAL - Abnormal; Notable for the following:    WBC 12.0 (*)    Neutrophils Relative % 79 (*)    Neutro Abs 9.7 (*)    All other components within normal limits  COMPREHENSIVE METABOLIC PANEL - Abnormal; Notable for the following:    Glucose, Bld 110 (*)    Albumin 3.4 (*)    All other components within normal limits  URINALYSIS, ROUTINE W REFLEX MICROSCOPIC - Abnormal; Notable for the following:    Color, Urine AMBER  (*)    APPearance CLOUDY (*)    Ketones, ur 15 (*)    Leukocytes, UA SMALL (*)    All other components within normal limits  URINE MICROSCOPIC-ADD ON  POC URINE PREG, ED    Imaging Review No results found.   EKG Interpretation None      MDM   Final diagnoses:  Gastroenteritis    At this point in time patient's findings are consistent with gastroenteritis. She does not have abdominal pain nor reproducible abdominal tenderness. The patient is afebrile. she has been rehydrated with fluids and will be discharged with antiemetics.    Arby Barrette, MD 10/15/13 [phone]

## 2013-10-15 NOTE — ED Notes (Signed)
Bed: WA19 Expected date:  Expected time:  Means of arrival:  Comments: EMS/n/v 

## 2013-10-15 NOTE — ED Notes (Signed)
  Pt to ED by EMS. Pt co vomiting x5, nausea and diarrhea. Pt alert and oriented x4. Pt denies pain. No medical HX. Two family members had similar symptoms 5 days ago.

## 2013-10-15 NOTE — Discharge Instructions (Signed)
°Emergency Department Resource Guide °1) Find a Doctor and Pay Out of Pocket °Although you won't have to find out who is covered by your insurance plan, it is a good idea to ask around and get recommendations. You will then need to call the office and see if the doctor you have chosen will accept you as a new patient and what types of options they offer for patients who are self-pay. Some doctors offer discounts or will set up payment plans for their patients who do not have insurance, but you will need to ask so you aren't surprised when you get to your appointment. ° °2) Contact Your Local Health Department °Not all health departments have doctors that can see patients for sick visits, but many do, so it is worth a call to see if yours does. If you don't know where your local health department is, you can check in your phone book. The CDC also has a tool to help you locate your state's health department, and many state websites also have listings of all of their local health departments. ° °3) Find a Walk-in Clinic °If your illness is not likely to be very severe or complicated, you may want to try a walk in clinic. These are popping up all over the country in pharmacies, drugstores, and shopping centers. They're usually staffed by nurse practitioners or physician assistants that have been trained to treat common illnesses and complaints. They're usually fairly quick and inexpensive. However, if you have serious medical issues or chronic medical problems, these are probably not your best option. ° °No Primary Care Doctor: °- Call [facility] at  832-8000 - they can help you locate a primary care doctor that  accepts your insurance, provides certain services, etc. °- Physician Referral Service- 1-800-533-3463 ° °Chronic Pain Problems: °Organization         Address  Phone   Notes  °[facility]  (336) 297-2271 Patients need to be referred by their primary care doctor.  ° °Medication  Assistance: °Organization         Address  Phone   Notes  °[place] [facility] [address] °[place],  [address] (336) 641-8030 --Must be a resident of [place] °-- Must have NO insurance coverage whatsoever (no Medicaid/ Medicare, etc.) °-- The pt. MUST have a primary care doctor that directs their care regularly and follows them in the community °  °[facility]  (866) 331-1348   °[employer]  (888) 892-1162   ° °Agencies that provide inexpensive medical care: °Organization         Address  Phone   Notes  °[facility]  (336) 832-8035   °[facility]    (336) 832-7272   °[facility] [address] °[place],  [address] (336) 832-4777   °[facility] [address], °[place] (336) 271-4999   °[facility]    (336) 373-0678   °[facility]    (336) 272-1050   °[facility] ° [address], [place] Phone:  (336) 832-4444, Fax:  (336) 832-4440 Hours of Operation:  9 am - 6 pm, M-F.  Also accepts Medicaid/Medicare and self-pay.  °[facility] ° [address], [place] Phone: (336) 832-3150, Fax: (336) 832-3151. Hours of Operation:  8:30 am - 5:30 pm, M-F.  Also accepts Medicaid and self-pay.  °[facility] [place] [address], [place] Phone: (336) 878-6027   °[facility] [address], [place], [place] (336)723-1848, Ext. 123 Mondays & Thursdays: 7-9 AM.  First 15 patients are seen on a first come, first serve basis. °  ° °Medicaid-accepting [place] Providers: ° °Organization         Address  Phone   Notes  °[facility] [address], [place] (336) 641-2100 Also accepts self-pay patients.  °[facility] [address], [place] ° (336) 856-9996   °[facility] [address], [place]  (336) 288-8857   °[facility] [address] [place] [address], [place] (336) 299-7000   °Veita Bland [address], [place]  ° (336) 373-1557 Only accepts [place] Access Medicaid patients after they have their name applied to their card.  ° °Self-Pay (no insurance) in [place]: ° °Organization         Address  Phone   Notes  °Sickle Cell Patients, [facility] [address], [place] (336) 832-1970   °[facility] [address], [place] (336) 832-4400   °[facility] ° [address], [place] (336) 992-4800   °[facility]/Dr. Osei-Bonsu ° [address] [place] [address], [place] or [address], [place] (336) 841-8500 Phone number for both [place] and [place] locations is the same.  °[facility] [address], [place] (336) 299-0000   °[facility] [address] [place] [address], [place] or [address] (336) 852-7530 °(336) 878-2260   °[facility] [address], [place] (336) 350-1642, phone; (336) 294-5005, fax Sees patients 1st and 3rd Saturday of every month.  Must not qualify for public or private insurance (i.e. Medicaid, Medicare, [place] Health Choice, Veterans' Benefits) • Household income should be no more than 200% of the poverty level •The clinic cannot treat you if you are pregnant or think you are pregnant • Sexually transmitted diseases are not treated at the clinic.  ° ° °Dental Care: °Organization         Address  Phone  Notes  °[place] [facility] [address], [place] (336) 641-6152 Accepts children up to age 21 who are enrolled in Medicaid or [place] Health Choice; pregnant women with a Medicaid card; and children who have applied for Medicaid or [place] Health Choice, but were declined, whose parents can pay a reduced fee at time of service.  °[place]  [facility] [place]  [address], [place] (336) 641-7733 Accepts children up to age 21 who are enrolled in Medicaid or [place] Health Choice; pregnant women with a Medicaid card; and children who have applied for Medicaid or [place] Health Choice, but were declined, whose parents can pay a reduced fee at time of service.  °[facility] ° [address], [place] (336) 641-4533 Patients are seen by appointment only. Walk-ins are not accepted. [facility] will see patients 18 years of age and older. °Monday - Tuesday (8am-5pm) °Most Wednesdays (8:30-5pm) °$30 per visit, cash only  °[facility] ° [address], [place] (336) 641-4533 Patients are seen by appointment only. Walk-ins are not accepted. [facility] will see patients 18 years of age and older. °One   Wednesday Evening (Monthly: Volunteer Based).  $30 per visit, cash only  °[facility]  (919) 537-3737 for adults; Children under age 4, call Graduate Pediatric Dentistry at ([phone]) [phone]. Children aged 4-14, please call ([phone]) [phone] to request a pediatric application. ° Dental services are provided in all areas of dental care including fillings, crowns and bridges, complete and partial dentures, implants, gum treatment, root canals, and extractions. Preventive care is also provided. Treatment is provided to both adults and children. °Patients are selected via a lottery and there is often a waiting list. °  °[facility] [address], °[place] ° (336) 763-8833 [email or web address] °  °[facility] [address], [place], [place] (336)723-1848, Ext. 123 Second and Fourth Thursday of each month, opens at 6:30 AM; Clinic ends at 9 AM.  Patients are seen on a first-come first-served basis, and a limited number are seen during each clinic.  ° °[facility] ° [address], [place], [place] (336) 723-7904    Eligibility Requirements °You must have lived in [place], [place], or [place] counties for at least the last three months. °  You cannot be eligible for state or federal sponsored healthcare insurance, including Veterans Administration, Medicaid, or Medicare. °  You generally cannot be eligible for healthcare insurance through your employer.  °  How to apply: °Eligibility screenings are held every Tuesday and Wednesday afternoon from 1:00 pm until 4:00 pm. You do not need an appointment for the interview!  °[facility] [address], [place], [place] 336-631-2330   °[facility]  336-342-8273   °[facility]  336-703-3100   °[facility]  336-570-6415   ° °Behavioral Health Resources in the Community: °Intensive Outpatient Programs °Organization         Address  Phone  Notes  °[facility] [address], [place], [place] 336-878-6098   °[facility] [address], [place], [place] 336-832-9800   °[facility] [address], [place], [place] ° 336-882-2125   °[facility] [address],  °[place], [place] 1-800-853-5163 or 336-641-4981   °Substance Abuse Resources °Organization         Address  Phone  Notes  °[facility]  336-882-2125   °[facility]  336-784-9470   °[facility]  336-285-9073   °[facility]  336-845-3988   °Residential & Outpatient Substance Abuse Program  1-800-659-3381   °Psychological Services °Organization         Address  Phone  Notes  °[facility]  336- 832-9600   °[facility]  336- 378-7881   °[facility] [address], [place] 1-800-853-5163 or 336-641-4981   ° °Mobile Crisis Teams °Organization         Address  Phone  Notes  °[facility], Mobile Crisis Care Unit  1-877-626-1772   °Assertive °[facility] ° [address]  [place], [place] 336-834-9664   °Sharon DeEsch [address] °[place] 336-554-5454   ° °Self-Help/Support Groups °Organization         Address  Phone             Notes  °[facility]  - variety of support groups  336- 373-1402 Call for more information  °Narcotics Anonymous (NA), [facility] [address], °[place]  2 meetings at this location  ° °  Residential Treatment Programs °Organization         Address  Phone  Notes  °[facility] [address],    °[place]  1-866-801-8205   °[facility] ° [address], [place], [place] 704-293-8524   °[facility] [address], [place] 336-845-3988 Admissions: 8am-3pm M-F  °[facility] [address]    °[place], [place] 336-841-1104   °[facility] [address], [place], [place] 336-379-7146   °[facility] [address]  °[place], [place] 336-285-9073   °[facility] - Intensive Outpatient [address], [place], [place] 336-852-3033   °[facility] [address]  °[place], [place] 1-877-615-2722 or 336-784-9470   °[facility] [address] [place], [place] 336-227-7417 Accepts Medicaid  °[facility] [address]  °[place] 1-800-659-3381 Substance Abuse/Addiction Treatment  ° °[place] Behavioral Health Resources °Organization         Address  Phone  Notes  °[facility]  (888) 581-9988   °Julie Brannon, PhD [address] [place], [place]   (336) 349-5553 or (336) 951-0000   °[facility]   [address] °[place], [place] (336) 349-4454   °[facility] [address], [place], [place] (336) 342-8316 Insurance/Medicaid/sponsorship through [facility]  °[facility] [address]                                    [place], [place] (336) 342-8316 Therapy/tele-psych/case    °[facility] [address]  ° [place], [place] (336) 349-2233    °Dr. Arfeen  (336) 349-4544   °[facility] [place]  [employer] [place] [facility] 1) [address],  °2) [address], [place] °3)  [address], [place] (336) 349-3220 °(336) 342-7768 ° °(336) 342-8140   °[place] [facility] (336) 342-1394 or (336) 342-3537 (After Hours)    ° ° °

## 2013-10-15 NOTE — Progress Notes (Signed)
[facility] [employer],  Did not get to see patient but will be sending information about [facility] [employer], to help patient establish primary care, using the address provided.

## 2013-10-21 ENCOUNTER — Telehealth: Payer: Self-pay

## 2013-10-21 NOTE — Telephone Encounter (Signed)
Called and spoke with patient regarding recent ED visit.  Scheduled her for F/U on 9/24.

## 2013-11-11 ENCOUNTER — Ambulatory Visit: Payer: Self-pay | Admitting: Pediatrics

## 2014-07-04 ENCOUNTER — Encounter (HOSPITAL_COMMUNITY): Payer: Self-pay

## 2014-07-04 ENCOUNTER — Emergency Department (HOSPITAL_COMMUNITY): Payer: Medicaid Other

## 2014-07-04 ENCOUNTER — Emergency Department (HOSPITAL_COMMUNITY)
Admission: EM | Admit: 2014-07-04 | Discharge: 2014-07-04 | Disposition: A | Discharge status: Home / Self Care | Payer: Self-pay | Attending: Emergency Medicine | Admitting: Emergency Medicine

## 2014-07-04 DIAGNOSIS — B9689 Other specified bacterial agents as the cause of diseases classified elsewhere: Secondary | ICD-10-CM

## 2014-07-04 DIAGNOSIS — N940 Mittelschmerz: Secondary | ICD-10-CM | POA: Insufficient documentation

## 2014-07-04 DIAGNOSIS — Z8669 Personal history of other diseases of the nervous system and sense organs: Secondary | ICD-10-CM | POA: Insufficient documentation

## 2014-07-04 DIAGNOSIS — R1030 Lower abdominal pain, unspecified: Secondary | ICD-10-CM

## 2014-07-04 DIAGNOSIS — Z3202 Encounter for pregnancy test, result negative: Secondary | ICD-10-CM | POA: Insufficient documentation

## 2014-07-04 DIAGNOSIS — Z91040 Latex allergy status: Secondary | ICD-10-CM | POA: Insufficient documentation

## 2014-07-04 DIAGNOSIS — R102 Pelvic and perineal pain: Secondary | ICD-10-CM

## 2014-07-04 DIAGNOSIS — N760 Acute vaginitis: Secondary | ICD-10-CM | POA: Insufficient documentation

## 2014-07-04 DIAGNOSIS — Z720 Tobacco use: Secondary | ICD-10-CM | POA: Insufficient documentation

## 2014-07-04 LAB — CBC WITH DIFFERENTIAL/PLATELET
BASOS PCT: 1 % (ref 0–1)
Basophils Absolute: 0 10*3/uL (ref 0.0–0.1)
EOS PCT: 9 % — AB (ref 0–5)
Eosinophils Absolute: 0.5 10*3/uL (ref 0.0–0.7)
HEMATOCRIT: 38.1 % (ref 36.0–46.0)
Hemoglobin: 12.3 g/dL (ref 12.0–15.0)
Lymphocytes Relative: 39 % (ref 12–46)
Lymphs Abs: 2.3 10*3/uL (ref 0.7–4.0)
MCH: 28 pg (ref 26.0–34.0)
MCHC: 32.3 g/dL (ref 30.0–36.0)
MCV: 86.8 fL (ref 78.0–100.0)
MONO ABS: 0.5 10*3/uL (ref 0.1–1.0)
Monocytes Relative: 9 % (ref 3–12)
Neutro Abs: 2.4 10*3/uL (ref 1.7–7.7)
Neutrophils Relative %: 42 % — ABNORMAL LOW (ref 43–77)
Platelets: 214 10*3/uL (ref 150–400)
RBC: 4.39 MIL/uL (ref 3.87–5.11)
RDW: 14.8 % (ref 11.5–15.5)
WBC: 5.8 10*3/uL (ref 4.0–10.5)

## 2014-07-04 LAB — WET PREP, GENITAL
Trich, Wet Prep: NONE SEEN
Yeast Wet Prep HPF POC: NONE SEEN

## 2014-07-04 LAB — URINALYSIS, ROUTINE W REFLEX MICROSCOPIC
BILIRUBIN URINE: NEGATIVE
Glucose, UA: NEGATIVE mg/dL
Hgb urine dipstick: NEGATIVE
Ketones, ur: NEGATIVE mg/dL
Leukocytes, UA: NEGATIVE
Nitrite: NEGATIVE
PH: 8 (ref 5.0–8.0)
Protein, ur: NEGATIVE mg/dL
Specific Gravity, Urine: 1.018 (ref 1.005–1.030)
Urobilinogen, UA: 1 mg/dL (ref 0.0–1.0)

## 2014-07-04 LAB — I-STAT CHEM 8, ED
BUN: 5 mg/dL — AB (ref 6–20)
CALCIUM ION: 1.25 mmol/L — AB (ref 1.12–1.23)
CREATININE: 0.7 mg/dL (ref 0.44–1.00)
Chloride: 107 mmol/L (ref 101–111)
Glucose, Bld: 85 mg/dL (ref 65–99)
HCT: 40 % (ref 36.0–46.0)
HEMOGLOBIN: 13.6 g/dL (ref 12.0–15.0)
Potassium: 4.2 mmol/L (ref 3.5–5.1)
SODIUM: 142 mmol/L (ref 135–145)
TCO2: 21 mmol/L (ref 0–100)

## 2014-07-04 LAB — LIPASE, BLOOD: LIPASE: 23 U/L (ref 22–51)

## 2014-07-04 LAB — POC URINE PREG, ED: Preg Test, Ur: NEGATIVE

## 2014-07-04 MED ORDER — CEFTRIAXONE SODIUM 250 MG IJ SOLR
250.0000 mg | Freq: Once | INTRAMUSCULAR | Status: AC
  Administered 2014-07-04: 250 mg via INTRAMUSCULAR
  Filled 2014-07-04: qty 250

## 2014-07-04 MED ORDER — LIDOCAINE HCL (PF) 1 % IJ SOLN
0.9000 mL | Freq: Once | INTRAMUSCULAR | Status: AC
  Administered 2014-07-04: 0.9 mL
  Filled 2014-07-04: qty 5

## 2014-07-04 MED ORDER — METRONIDAZOLE 500 MG PO TABS: 500.0000 mg | ORAL_TABLET | Freq: Two times a day (BID) | ORAL | Status: DC

## 2014-07-04 MED ORDER — AZITHROMYCIN 250 MG PO TABS
1000.0000 mg | ORAL_TABLET | Freq: Once | ORAL | Status: AC
  Administered 2014-07-04: 1000 mg via ORAL
  Filled 2014-07-04: qty 4

## 2014-07-04 MED ORDER — NAPROXEN 500 MG PO TABS: 500.0000 mg | ORAL_TABLET | Freq: Two times a day (BID) | ORAL | Status: DC | PRN

## 2014-07-04 MED ORDER — SODIUM CHLORIDE 0.9 % IV BOLUS (SEPSIS)
500.0000 mL | Freq: Once | INTRAVENOUS | Status: AC
  Administered 2014-07-04: 500 mL via INTRAVENOUS

## 2014-07-04 MED ORDER — KETOROLAC TROMETHAMINE 30 MG/ML IJ SOLN
30.0000 mg | Freq: Once | INTRAMUSCULAR | Status: AC
  Administered 2014-07-04: 30 mg via INTRAVENOUS
  Filled 2014-07-04: qty 1

## 2014-07-04 NOTE — ED Provider Notes (Signed)
CSN: [id number]     Arrival date & time 07/04/14  1156 History   First MD Initiated Contact with Patient 07/04/14 1210     Chief Complaint  Patient presents with  . Pelvic Pain     (Consider location/radiation/quality/duration/timing/severity/associated sxs/prior Treatment) HPI Comments: Nicole Rose is a 24 y.o. female with a PMHx of chronic migraines, who presents to the ED with complaints of 3 weeks of intermittent lower abdominal pain and intermittent nausea. She reports that currently she has neither of these symptoms, but they occurred this morning which prompted her to be evaluated today. She reports the pain is 7/10 intermittent pressure-like pain in the lower pelvic region bilaterally and in the midline, nonradiating, with no known aggravating factors, and relieved with lying on her stomach and pulling her knees up towards her chest. She has not tried any other medications prior to arrival. She denies any fevers, chills, chest pain, shortness of breath, vomiting, diarrhea, constipation, melena, hematochezia, dysuria, hematuria, back or flank pain, vaginal bleeding or discharge, myalgias, arthralgias, numbness, tingling, weakness, rashes, recent travel, sick contacts, suspicious food intake, antibiotic use, alcohol use, or NSAIDs. She is sexually active with 2 partners, one female and one female, unprotected but she states that the condom broke on 06/15/14. She reports her last menstrual period was 06/19/14.  Patient is a 24 y.o. female presenting with pelvic pain. The history is provided by the patient. No language interpreter was used.  Pelvic Pain This is a new problem. The current episode started 1 to 4 weeks ago. The problem occurs intermittently. The problem has been resolved (currently resolved, occurred this morning). Associated symptoms include abdominal pain (intermittent lower abd pain, none at this time) and nausea (intermittently, none currently). Pertinent negatives include no  arthralgias, chest pain, chills, fever, myalgias, numbness, rash, vomiting or weakness. Nothing aggravates the symptoms. She has tried position changes (lying on her stomach or bending her knees up) for the symptoms. The treatment provided mild relief.    Past Medical History  Diagnosis Date  . Headache(784.0)     migraine   Past Surgical History  Procedure Laterality Date  . Breast surgery Right 2/14    abscess excise  . Breast biopsy Right 09/28/2012    Procedure: EXCISION OF CHRONIC RIGHT BREAST ABSCESS;  Surgeon: Almond Lint, MD;  Location: [facility] OR;  Service: General;  Laterality: Right;   Family History  Problem Relation Age of Onset  . Cancer Maternal Aunt     breast  . Cancer Paternal Aunt     lung  . Cancer Maternal Aunt     breast  . Cancer Maternal Aunt     ovarian  . Cancer Maternal Aunt     ovarian   History  Substance Use Topics  . Smoking status: Current Some Day Smoker -- 0.50 packs/day for 2 years    Types: Cigarettes  . Smokeless tobacco: Never Used  . Alcohol Use: Yes   OB History    No data available     Review of Systems  Constitutional: Negative for fever and chills.  Respiratory: Negative for shortness of breath.   Cardiovascular: Negative for chest pain.  Gastrointestinal: Positive for nausea (intermittently, none currently) and abdominal pain (intermittent lower abd pain, none at this time). Negative for vomiting, diarrhea, constipation and blood in stool.  Genitourinary: Positive for pelvic pain. Negative for dysuria, hematuria, flank pain, vaginal bleeding, vaginal discharge, vaginal pain and menstrual problem.  Musculoskeletal: Negative for myalgias, back pain and  arthralgias.  Skin: Negative for rash.  Allergic/Immunologic: Negative for immunocompromised state.  Neurological: Negative for weakness and numbness.  Psychiatric/Behavioral: Negative for confusion.   10 Systems reviewed and are negative for acute change except as noted in the  HPI.    Allergies  Latex  Home Medications   Prior to Admission medications   Medication Sig Start Date End Date Taking? Authorizing Provider  promethazine (PHENERGAN) 25 MG tablet Take 1 tablet (25 mg total) by mouth every 6 (six) hours as needed for nausea or vomiting. 10/15/13   Arby Barrette, MD   BP 122/64 mmHg  Pulse 86  Temp(Src) 98.4 F (36.9 C) (Oral)  Resp 18  Ht  (1.727 m)  Wt 205 lb (92.987 kg)  BMI 31.18 kg/m2  SpO2 100%  LMP 06/19/2014 Physical Exam  Constitutional: She is oriented to person, place, and time. Vital signs are normal. She appears well-developed and well-nourished.  Non-toxic appearance. No distress.  Afebrile, nontoxic, NAD  HENT:  Head: Normocephalic and atraumatic.  Mouth/Throat: Oropharynx is clear and moist and mucous membranes are normal.  Eyes: Conjunctivae and EOM are normal. Right eye exhibits no discharge. Left eye exhibits no discharge.  Neck: Normal range of motion. Neck supple.  Cardiovascular: Normal rate, regular rhythm, normal heart sounds and intact distal pulses.  Exam reveals no gallop and no friction rub.   No murmur heard. Pulmonary/Chest: Effort normal and breath sounds normal. No respiratory distress. She has no decreased breath sounds. She has no wheezes. She has no rhonchi. She has no rales.  Abdominal: Soft. Normal appearance and bowel sounds are normal. She exhibits no distension. There is tenderness in the right lower quadrant, suprapubic area and left lower quadrant. There is no rigidity, no rebound, no guarding, no CVA tenderness, no tenderness at McBurney's point and negative Murphy's sign.    Soft, nondistended, +BS throughout, with very trace tenderness along pelvic brim in midline and bilateral quadrants, no r/g/r, neg murphy's, neg mcburney's, no CVA TTP   Genitourinary: Uterus normal. Pelvic exam was performed with patient supine. There is no rash, tenderness or lesion on the right labia. There is no rash,  tenderness or lesion on the left labia. Cervix exhibits discharge. Cervix exhibits no motion tenderness and no friability. Right adnexum displays no mass, no tenderness and no fullness. Left adnexum displays tenderness. Left adnexum displays no mass and no fullness. No erythema, tenderness or bleeding in the vagina. Vaginal discharge found.  Chaperone present for exam. No rashes, lesions, or tenderness to external genitalia. No erythema, injury, or tenderness to vaginal mucosa.  Mild white thick vaginal discharge in vault, no bleeding within vaginal vault. No adnexal masses or fullness but L adnexal tenderness to palpation. No CMT or cervical friability, but mild amount of white discharge from cervical os. Uterus non-deviated, mobile, nonTTP, and without enlargement.    Musculoskeletal: Normal range of motion.  Neurological: She is alert and oriented to person, place, and time. She has normal strength. No sensory deficit.  Skin: Skin is warm, dry and intact. No rash noted.  Psychiatric: She has a normal mood and affect.  Nursing note and vitals reviewed.   ED Course  Procedures (including critical care time) Labs Review Labs Reviewed  WET PREP, GENITAL - Abnormal; Notable for the following:    Clue Cells Wet Prep HPF POC MANY (*)    WBC, Wet Prep HPF POC MANY (*)    All other components within normal limits  CBC WITH DIFFERENTIAL/PLATELET -  Abnormal; Notable for the following:    Neutrophils Relative % 42 (*)    Eosinophils Relative 9 (*)    All other components within normal limits  I-STAT CHEM 8, ED - Abnormal; Notable for the following:    BUN 5 (*)    Calcium, Ion 1.25 (*)    All other components within normal limits  URINALYSIS, ROUTINE W REFLEX MICROSCOPIC  LIPASE, BLOOD  RPR  HIV ANTIBODY (ROUTINE TESTING)  POC URINE PREG, ED  GC/CHLAMYDIA PROBE AMP ([place])    Imaging Review US Transvaginal Non-ob  07/04/2014   CLINICAL DATA:  24 year old female with 3 week history of  lower abdominal pain  EXAM: TRANSABDOMINAL AND TRANSVAGINAL ULTRASOUND OF PELVIS  DOPPLER ULTRASOUND OF OVARIES  TECHNIQUE: Both transabdominal and transvaginal ultrasound examinations of the pelvis were performed. Transabdominal technique was performed for global imaging of the pelvis including uterus, ovaries, adnexal regions, and pelvic cul-de-sac.  It was necessary to proceed with endovaginal exam following the transabdominal exam to visualize the endometrium. Color and duplex Doppler ultrasound was utilized to evaluate blood flow to the ovaries.  COMPARISON:  None.  FINDINGS: Uterus  Measurements: 7.7 x 2.8 x 4.9 cm. No fibroids or other mass visualized.  Endometrium  Thickness: 10.3 cm.  No focal abnormality visualized.  Right ovary  Measurements: 3.9 x 1.8 x 2.2 cm. Normal appearance/no adnexal mass.  Left ovary  Measurements: 2.3 x 2.2 x 1.8 cm. Normal appearance/no adnexal mass.  Pulsed Doppler evaluation of both ovaries demonstrates normal low-resistance arterial and venous waveforms.  Other findings  No free fluid.  IMPRESSION: Normal transabdominal and transvaginal ultrasound. No evidence of ovarian torsion or other abnormality.   Electronically Signed   By: Malachy Moan M.D.   On: 07/04/2014 15:45   US Pelvis Complete  07/04/2014   CLINICAL DATA:  24 year old female with 3 week history of lower abdominal pain  EXAM: TRANSABDOMINAL AND TRANSVAGINAL ULTRASOUND OF PELVIS  DOPPLER ULTRASOUND OF OVARIES  TECHNIQUE: Both transabdominal and transvaginal ultrasound examinations of the pelvis were performed. Transabdominal technique was performed for global imaging of the pelvis including uterus, ovaries, adnexal regions, and pelvic cul-de-sac.  It was necessary to proceed with endovaginal exam following the transabdominal exam to visualize the endometrium. Color and duplex Doppler ultrasound was utilized to evaluate blood flow to the ovaries.  COMPARISON:  None.  FINDINGS: Uterus  Measurements: 7.7 x  2.8 x 4.9 cm. No fibroids or other mass visualized.  Endometrium  Thickness: 10.3 cm.  No focal abnormality visualized.  Right ovary  Measurements: 3.9 x 1.8 x 2.2 cm. Normal appearance/no adnexal mass.  Left ovary  Measurements: 2.3 x 2.2 x 1.8 cm. Normal appearance/no adnexal mass.  Pulsed Doppler evaluation of both ovaries demonstrates normal low-resistance arterial and venous waveforms.  Other findings  No free fluid.  IMPRESSION: Normal transabdominal and transvaginal ultrasound. No evidence of ovarian torsion or other abnormality.   Electronically Signed   By: Malachy Moan M.D.   On: 07/04/2014 15:45   [place] Art/ven Flow Abd Pelv Doppler  07/04/2014   CLINICAL DATA:  24 year old female with 3 week history of lower abdominal pain  EXAM: TRANSABDOMINAL AND TRANSVAGINAL ULTRASOUND OF PELVIS  DOPPLER ULTRASOUND OF OVARIES  TECHNIQUE: Both transabdominal and transvaginal ultrasound examinations of the pelvis were performed. Transabdominal technique was performed for global imaging of the pelvis including uterus, ovaries, adnexal regions, and pelvic cul-de-sac.  It was necessary to proceed with endovaginal exam following the transabdominal exam to visualize  the endometrium. Color and duplex Doppler ultrasound was utilized to evaluate blood flow to the ovaries.  COMPARISON:  None.  FINDINGS: Uterus  Measurements: 7.7 x 2.8 x 4.9 cm. No fibroids or other mass visualized.  Endometrium  Thickness: 10.3 cm.  No focal abnormality visualized.  Right ovary  Measurements: 3.9 x 1.8 x 2.2 cm. Normal appearance/no adnexal mass.  Left ovary  Measurements: 2.3 x 2.2 x 1.8 cm. Normal appearance/no adnexal mass.  Pulsed Doppler evaluation of both ovaries demonstrates normal low-resistance arterial and venous waveforms.  Other findings  No free fluid.  IMPRESSION: Normal transabdominal and transvaginal ultrasound. No evidence of ovarian torsion or other abnormality.   Electronically Signed   By: Malachy MoanHeath  McCullough M.D.   On:  07/04/2014 15:45     EKG Interpretation None      MDM   Final diagnoses:  Left adnexal tenderness  Bacterial vaginosis  Lower abdominal pain  Mittelschmerz    24 y.o. female here with 3 wks of intermittent lower abd pain and nausea. States her LMP was 06/19/14, and she has protected sex with condoms but one time the condom broke on 06/15/14. On exam, nonperitoneal with very mild lower abd tenderness bilaterally and midline, will get pelvic exam, doubt need for CT imaging but will evaluate need for u/s based on pelvic exam. Will get basic labs, and U/A. No flank tenderness, no urinary symptoms, doubt UTI or pyelo. Intermittent pain x3wks, doubt appendicitis. Upreg neg, will give toradol for pain. Will give some fluids. Declined nausea meds stating she's not nauseated at this time. DDx includes mittleschmerz. Will reassess shortly.   1:56 PM Labs unremarkable, U/A clear. Pelvic performed with L adnexal tenderness and vaginal discharge noted. Will empirically treat for GC/CT, and obtain ultrasound to eval for cysts vs torsion vs TOA. Will reassess shortly. Pain currently controlled with toradol.  4:11 PM Wet prep revealing BV, will treat with flagyl. U/S negative for any emergent/acute abnormality. This pain could be mittelschmerz but doubt any further need for work up. Will have her f/up with [facility] for further management. Will give naprosyn for pain, discussed use with tylenol as needed. I explained the diagnosis and have given explicit precautions to return to the ER including for any other new or worsening symptoms. The patient understands and accepts the medical plan as it's been dictated and I have answered their questions. Discharge instructions concerning home care and prescriptions have been given. The patient is STABLE and is discharged to home in good condition.  BP 100/53 mmHg  Pulse 70  Temp(Src) 98.4 F (36.9 C) (Oral)  Resp 16  Ht 5\' 8"  (1.727 m)  Wt 205 lb (92.987 kg)   BMI 31.18 kg/m2  SpO2 99%  LMP 06/19/2014  Meds ordered this encounter  Medications  . sodium chloride 0.9 % bolus 500 mL    Sig:   . ketorolac (TORADOL) 30 MG/ML injection 30 mg    Sig:   . azithromycin (ZITHROMAX) tablet 1,000 mg    Sig:    And  . cefTRIAXone (ROCEPHIN) injection 250 mg    Sig:     Order Specific Question:  Antibiotic Indication:    Answer:  STD  . lidocaine (PF) (XYLOCAINE) 1 % injection 0.9 mL    Sig:   . ibuprofen (ADVIL,MOTRIN) 200 MG tablet    Sig: Take 200 mg by mouth every 6 (six) hours as needed for fever or moderate pain.  . metroNIDAZOLE (FLAGYL) 500 MG tablet  Sig: Take 1 tablet (500 mg total) by mouth 2 (two) times daily. One po bid x 7 days    Dispense:  14 tablet    Refill:  0    Order Specific Question:  Supervising Provider    Answer:  Hyacinth MeekerMILLER, BRIAN [[id number]]  . naproxen (NAPROSYN) 500 MG tablet    Sig: Take 1 tablet (500 mg total) by mouth 2 (two) times daily as needed for mild pain, moderate pain or headache (TAKE WITH MEALS.).    Dispense:  20 tablet    Refill:  0    Order Specific Question:  Supervising Provider    Answer:  Eber HongMILLER, BRIAN [[id number]]     Rubye Strohmeyer Camprubi-Soms, PA-C 07/04/14 1612  Pricilla LovelessScott Goldston, MD 07/06/14 0028

## 2014-07-04 NOTE — ED Notes (Signed)
Pt. Is having bilateral pelvic pain, intermiitent.  Denies any vagina bleeding or discharge . Denies any uti symptoms.;  Intermittent nausea , denies any v/d  Would like a preg. Test.   LMP May 2015

## 2014-07-04 NOTE — Discharge Instructions (Signed)
Your abdominal pain is likely from ovulatory pain. You also have bacterial vaginosis. Take flagyl as directed for this bacterial infection, do not drink alcohol while taking this. Use tylenol or naprosyn as directed as needed for pain. Follow up with women's clinic in 1 week for recheck of symptoms and ongoing management. [facility] for future STD concerns or screenings. This is the recommendation by the CDC for people with multiple sexual partners or hx of STDs. You have been treated for gonorrhea and chlamydia in the ER but the hospital will call you if lab is positive. You were tested for HIV and Syphilis, and the hospital will call you if the lab is positive. Return to the ER for changes or worsening symptoms.  Abdominal (belly) pain can be caused by many things. Your caregiver performed an examination and possibly ordered blood/urine tests and imaging (CT scan, x-rays, ultrasound). Many cases can be observed and treated at home after initial evaluation in the emergency department. Even though you are being discharged home, abdominal pain can be unpredictable. Therefore, you need a repeated exam if your pain does not resolve, returns, or worsens. Most patients with abdominal pain don't have to be admitted to the hospital or have surgery, but serious problems like appendicitis and gallbladder attacks can start out as nonspecific pain. Many abdominal conditions cannot be diagnosed in one visit, so follow-up evaluations are very important. SEEK IMMEDIATE MEDICAL ATTENTION IF YOU DEVELOP ANY OF THE FOLLOWING SYMPTOMS:  The pain does not go away or becomes severe.   A temperature above 101 develops.   Repeated vomiting occurs (multiple episodes).   The pain becomes localized to portions of the abdomen. The right side could possibly be appendicitis. In an adult, the left lower portion of the abdomen could be colitis or diverticulitis.   Blood is being  passed in stools or vomit (bright red or black tarry stools).   Return also if you develop chest pain, difficulty breathing, dizziness or fainting, or become confused, poorly responsive, or inconsolable (young children).  The constipation stays for more than 4 days.   There is belly (abdominal) or rectal pain.   You do not seem to be getting better.     Pelvic Pain Female pelvic pain can be caused by many different things and start from a variety of places. Pelvic pain refers to pain that is located in the lower half of the abdomen and between your hips. The pain may occur over a short period of time (acute) or may be reoccurring (chronic). The cause of pelvic pain may be related to disorders affecting the female reproductive organs (gynecologic), but it may also be related to the bladder, kidney stones, an intestinal complication, or muscle or skeletal problems. Getting help right away for pelvic pain is important, especially if there has been severe, sharp, or a sudden onset of unusual pain. It is also important to get help right away because some types of pelvic pain can be life threatening.  CAUSES  Below are only some of the causes of pelvic pain. The causes of pelvic pain can be in one of several categories.   Gynecologic.  Pelvic inflammatory disease.  Sexually transmitted infection.  Ovarian cyst or a twisted ovarian ligament (ovarian torsion).  Uterine lining that grows outside the uterus (endometriosis).  Fibroids, cysts, or tumors.  Ovulation.  Pregnancy.  Pregnancy that occurs outside the uterus (ectopic pregnancy).  Miscarriage.  Labor.  Abruption of the  placenta or ruptured uterus.  Infection.  Uterine infection (endometritis).  Bladder infection.  Diverticulitis.  Miscarriage related to a uterine infection (septic abortion).  Bladder.  Inflammation of the bladder (cystitis).  Kidney  stone(s).  Gastrointestinal.  Constipation.  Diverticulitis.  Neurologic.  Trauma.  Feeling pelvic pain because of mental or emotional causes (psychosomatic).  Cancers of the bowel or pelvis. EVALUATION  Your caregiver will want to take a careful history of your concerns. This includes recent changes in your health, a careful gynecologic history of your periods (menses), and a sexual history. Obtaining your family history and medical history is also important. Your caregiver may suggest a pelvic exam. A pelvic exam will help identify the location and severity of the pain. It also helps in the evaluation of which organ system may be involved. In order to identify the cause of the pelvic pain and be properly treated, your caregiver may order tests. These tests may include:   A pregnancy test.  Pelvic ultrasonography.  An X-ray exam of the abdomen.  A urinalysis or evaluation of vaginal discharge.  Blood tests. HOME CARE INSTRUCTIONS   Only take over-the-counter or prescription medicines for pain, discomfort, or fever as directed by your caregiver.   Rest as directed by your caregiver.   Eat a balanced diet.   Drink enough fluids to make your urine clear or pale yellow, or as directed.   Avoid sexual intercourse if it causes pain.   Apply warm or cold compresses to the lower abdomen depending on which one helps the pain.   Avoid stressful situations.   Keep a journal of your pelvic pain. Write down when it started, where the pain is located, and if there are things that seem to be associated with the pain, such as food or your menstrual cycle.  Follow up with your caregiver as directed.  SEEK MEDICAL CARE IF:  Your medicine does not help your pain.  You have abnormal vaginal discharge. SEEK IMMEDIATE MEDICAL CARE IF:   You have heavy bleeding from the vagina.   Your pelvic pain increases.   You feel light-headed or faint.   You have chills.   You  have pain with urination or blood in your urine.   You have uncontrolled diarrhea or vomiting.   You have a fever or persistent symptoms for more than 3 days.  You have a fever and your symptoms suddenly get worse.   You are being physically or sexually abused.  MAKE SURE YOU:  Understand these instructions.  Will watch your condition.  Will get help if you are not doing well or get worse. Document Released: 01/02/2004 Document Revised: 06/21/2013 Document Reviewed: 05/27/2011 [facility] Patient Information 2015 [place]. This information is not intended to replace advice given to you by your health care provider. Make sure you discuss any questions you have with your health care provider.  Mittelschmerz  Mittelschmerz is lower abdominal pain that happens between menstrual periods. Mittelschmerz is a [place] word that means "middle pain." It may occur right before, during, or after ovulation. It is usually felt on either the right or left side, depending on which ovary is passing the egg.  CAUSES  Pain may be felt when:  There is irritation (inflammation) inside the abdomen. This is caused by the small amount of blood or fluid that may come from releasing the egg.  The covering of the ovary stretches.  Ovarian cysts develop.  You have endometriosis. This is when the uterine lining  tissue grows outside of the uterus.  You have endometriomas. These are cysts that are formed by endometrial tissue. SYMPTOMS  Pain may be:  One-sided pain unless both ovaries are ovulating at the same time. If both ovaries are ovulating, there may be pain on both sides. This pain is often repeated every month. At times, there may be a month or two with no pain.  Dull, cramping, or sharp.  Short-lived or last up to 24 to 48 hours.  Felt with bowel movements, diarrhea, or intercourse.  Accompanied by a slight amount of vaginal bleeding. DIAGNOSIS   Your caregiver will take a history and  do a physical exam.  Blood tests and abdominal ultrasounds may be performed if the problem continues, becomes worse, or does not respond to the usual treatment.  A thin, lighted tube may be put into your abdomen (laparoscopy) to check for problems if the pain gets worse or does not go away. TREATMENT  Usually, no treatment is needed. If treatment is needed, it may include:  Taking over-the-counter pain relievers.  Taking birth control pills (oral contraceptives). This may be used to stop ovulation.  Medical or surgical treatment if you have endometriomas. Together, you and your caregiver can decide which course of treatment is best for you. HOME CARE INSTRUCTIONS   Only take over-the-counter or prescription medicines for pain, discomfort, or fever as directed by your caregiver. Do not use aspirin. Aspirin may increase bleeding.  Write down when the pain comes in relation to your menstrual period. Write down how bad it is, if you have a fever with the pain, and how long it lasts. SEEK MEDICAL CARE IF:   Your pain increases and is not controlled with medicine.  Your pain is on both sides of your abdomen.  You develop vaginal bleeding (more than just spotting) with the pain.  You have a fever.  You develop nausea or vomiting.  You feel lightheaded or faint. MAKE SURE YOU:   Understand these instructions.  Will watch your condition.  Will get help right away if you are not doing well or get worse. Document Released: 01/25/2002 Document Revised: 04/29/2011 Document Reviewed: 05/04/2010 [facility] Patient Information 2015 [place], [place]. This information is not intended to replace advice given to you by your health care provider. Make sure you discuss any questions you have with your health care provider.  Bacterial Vaginosis Bacterial vaginosis is a vaginal infection that occurs when the normal balance of bacteria in the vagina is disrupted. It results from an overgrowth of  certain bacteria. This is the most common vaginal infection in women of childbearing age. Treatment is important to prevent complications, especially in pregnant women, as it can cause a premature delivery. CAUSES  Bacterial vaginosis is caused by an increase in harmful bacteria that are normally present in smaller amounts in the vagina. Several different kinds of bacteria can cause bacterial vaginosis. However, the reason that the condition develops is not fully understood. RISK FACTORS Certain activities or behaviors can put you at an increased risk of developing bacterial vaginosis, including:  Having a new sex partner or multiple sex partners.  Douching.  Using an intrauterine device (IUD) for contraception. Women do not get bacterial vaginosis from toilet seats, bedding, swimming pools, or contact with objects around them. SIGNS AND SYMPTOMS  Some women with bacterial vaginosis have no signs or symptoms. Common symptoms include:  Grey vaginal discharge.  A fishlike odor with discharge, especially after sexual intercourse.  Itching or burning  of the vagina and vulva.  Burning or pain with urination. DIAGNOSIS  Your health care provider will take a medical history and examine the vagina for signs of bacterial vaginosis. A sample of vaginal fluid may be taken. Your health care provider will look at this sample under a microscope to check for bacteria and abnormal cells. A vaginal pH test may also be done.  TREATMENT  Bacterial vaginosis may be treated with antibiotic medicines. These may be given in the form of a pill or a vaginal cream. A second round of antibiotics may be prescribed if the condition comes back after treatment.  HOME CARE INSTRUCTIONS   Only take over-the-counter or prescription medicines as directed by your health care provider.  If antibiotic medicine was prescribed, take it as directed. Make sure you finish it even if you start to feel better.  Do not have sex  until treatment is completed.  Tell all sexual partners that you have a vaginal infection. They should see their health care provider and be treated if they have problems, such as a mild rash or itching.  Practice safe sex by using condoms and only having one sex partner. SEEK MEDICAL CARE IF:   Your symptoms are not improving after 3 days of treatment.  You have increased discharge or pain.  You have a fever. MAKE SURE YOU:   Understand these instructions.  Will watch your condition.  Will get help right away if you are not doing well or get worse. FOR MORE INFORMATION  Centers for Disease Control and Prevention, Division of STD Prevention: [employer] American Sexual Health Association (ASHA): www.ashastd.org  Document Released: 02/04/2005 Document Revised: 11/25/2012 Document Reviewed: 09/16/2012 [facility] Patient Information 2015 [place], [place]. This information is not intended to replace advice given to you by your health care provider. Make sure you discuss any questions you have with your health care provider.

## 2014-07-05 LAB — GC/CHLAMYDIA PROBE AMP (~~LOC~~) NOT AT ARMC
Chlamydia: NEGATIVE
NEISSERIA GONORRHEA: NEGATIVE

## 2014-07-05 LAB — RPR: RPR Ser Ql: NONREACTIVE

## 2014-07-05 LAB — HIV ANTIBODY (ROUTINE TESTING W REFLEX): HIV Screen 4th Generation wRfx: NONREACTIVE

## 2014-09-23 ENCOUNTER — Emergency Department (HOSPITAL_COMMUNITY): Payer: Self-pay

## 2014-09-23 ENCOUNTER — Emergency Department (HOSPITAL_COMMUNITY)
Admission: EM | Admit: 2014-09-23 | Discharge: 2014-09-23 | Disposition: A | Discharge status: Home / Self Care | Payer: Self-pay | Attending: Emergency Medicine | Admitting: Emergency Medicine

## 2014-09-23 ENCOUNTER — Encounter (HOSPITAL_COMMUNITY): Payer: Self-pay | Admitting: *Deleted

## 2014-09-23 DIAGNOSIS — Z3202 Encounter for pregnancy test, result negative: Secondary | ICD-10-CM | POA: Insufficient documentation

## 2014-09-23 DIAGNOSIS — J40 Bronchitis, not specified as acute or chronic: Secondary | ICD-10-CM

## 2014-09-23 DIAGNOSIS — Z720 Tobacco use: Secondary | ICD-10-CM | POA: Insufficient documentation

## 2014-09-23 DIAGNOSIS — R112 Nausea with vomiting, unspecified: Secondary | ICD-10-CM

## 2014-09-23 DIAGNOSIS — R197 Diarrhea, unspecified: Secondary | ICD-10-CM | POA: Insufficient documentation

## 2014-09-23 DIAGNOSIS — R52 Pain, unspecified: Secondary | ICD-10-CM | POA: Insufficient documentation

## 2014-09-23 DIAGNOSIS — Z91040 Latex allergy status: Secondary | ICD-10-CM | POA: Insufficient documentation

## 2014-09-23 LAB — URINALYSIS, ROUTINE W REFLEX MICROSCOPIC
Bilirubin Urine: NEGATIVE
Glucose, UA: NEGATIVE mg/dL
HGB URINE DIPSTICK: NEGATIVE
Ketones, ur: 15 mg/dL — AB
Leukocytes, UA: NEGATIVE
Nitrite: NEGATIVE
Protein, ur: NEGATIVE mg/dL
SPECIFIC GRAVITY, URINE: 1.03 (ref 1.005–1.030)
Urobilinogen, UA: 1 mg/dL (ref 0.0–1.0)
pH: 6 (ref 5.0–8.0)

## 2014-09-23 LAB — POC URINE PREG, ED: Preg Test, Ur: NEGATIVE

## 2014-09-23 MED ORDER — ONDANSETRON HCL 4 MG PO TABS: 4.0000 mg | ORAL_TABLET | Freq: Four times a day (QID) | ORAL | Status: DC

## 2014-09-23 MED ORDER — ONDANSETRON 4 MG PO TBDP
8.0000 mg | ORAL_TABLET | Freq: Once | ORAL | Status: AC
  Administered 2014-09-23: 8 mg via ORAL
  Filled 2014-09-23: qty 2

## 2014-09-23 MED ORDER — BENZONATATE 100 MG PO CAPS
100.0000 mg | ORAL_CAPSULE | Freq: Three times a day (TID) | ORAL | Status: DC
Start: 2014-09-23 — End: 2015-01-16

## 2014-09-23 MED ORDER — HYDROCOD POLST-CPM POLST ER 10-8 MG/5ML PO SUER
5.0000 mL | Freq: Once | ORAL | Status: AC
  Administered 2014-09-23: 5 mL via ORAL
  Filled 2014-09-23: qty 5

## 2014-09-23 MED ORDER — ALBUTEROL SULFATE HFA 108 (90 BASE) MCG/ACT IN AERS
2.0000 | INHALATION_SPRAY | Freq: Once | RESPIRATORY_TRACT | Status: AC
  Administered 2014-09-23: 2 via RESPIRATORY_TRACT
  Filled 2014-09-23: qty 6.7

## 2014-09-23 NOTE — Discharge Instructions (Signed)
Nausea, Adult Follow up with  and wellness.   Nausea is the feeling that you have an upset stomach or have to vomit. Nausea by itself is not likely a serious concern, but it may be an early sign of more serious medical problems. As nausea gets worse, it can lead to vomiting. If vomiting develops, there is the risk of dehydration.  CAUSES   Viral infections.  Food poisoning.  Medicines.  Pregnancy.  Motion sickness.  Migraine headaches.  Emotional distress.  Severe pain from any source.  Alcohol intoxication. HOME CARE INSTRUCTIONS  Get plenty of rest.  Ask your caregiver about specific rehydration instructions.  Eat small amounts of food and sip liquids more often.  Take all medicines as told by your caregiver. SEEK MEDICAL CARE IF:  You have not improved after 2 days, or you get worse.  You have a headache. SEEK IMMEDIATE MEDICAL CARE IF:   You have a fever.  You faint.  You keep vomiting or have blood in your vomit.  You are extremely weak or dehydrated.  You have dark or bloody stools.  You have severe chest or abdominal pain. MAKE SURE YOU:  Understand these instructions.  Will watch your condition.  Will get help right away if you are not doing well or get worse. Document Released: 03/14/2004 Document Revised: 10/30/2011 Document Reviewed: 10/17/2010 [facility] Patient Information 2015 [place], [place]. This information is not intended to replace advice given to you by your health care provider. Make sure you discuss any questions you have with your health care provider.  Upper Respiratory Infection, Adult An upper respiratory infection (URI) is also sometimes known as the common cold. The upper respiratory tract includes the nose, sinuses, throat, trachea, and bronchi. Bronchi are the airways leading to the lungs. Most people improve within 1 week, but symptoms can last up to 2 weeks. A residual cough may last even longer.  CAUSES Many  different viruses can infect the tissues lining the upper respiratory tract. The tissues become irritated and inflamed and often become very moist. Mucus production is also common. A cold is contagious. You can easily spread the virus to others by oral contact. This includes kissing, sharing a glass, coughing, or sneezing. Touching your mouth or nose and then touching a surface, which is then touched by another person, can also spread the virus. SYMPTOMS  Symptoms typically develop 1 to 3 days after you come in contact with a cold virus. Symptoms vary from person to person. They may include:  Runny nose.  Sneezing.  Nasal congestion.  Sinus irritation.  Sore throat.  Loss of voice (laryngitis).  Cough.  Fatigue.  Muscle aches.  Loss of appetite.  Headache.  Low-grade fever. DIAGNOSIS  You might diagnose your own cold based on familiar symptoms, since most people get a cold 2 to 3 times a year. Your caregiver can confirm this based on your exam. Most importantly, your caregiver can check that your symptoms are not due to another disease such as strep throat, sinusitis, pneumonia, asthma, or epiglottitis. Blood tests, throat tests, and X-rays are not necessary to diagnose a common cold, but they may sometimes be helpful in excluding other more serious diseases. Your caregiver will decide if any further tests are required. RISKS AND COMPLICATIONS  You may be at risk for a more severe case of the common cold if you smoke cigarettes, have chronic heart disease (such as heart failure) or lung disease (such as asthma), or if you have  a weakened immune system. The very young and very old are also at risk for more serious infections. Bacterial sinusitis, middle ear infections, and bacterial pneumonia can complicate the common cold. The common cold can worsen asthma and chronic obstructive pulmonary disease (COPD). Sometimes, these complications can require emergency medical care and may be  life-threatening. PREVENTION  The best way to protect against getting a cold is to practice good hygiene. Avoid oral or hand contact with people with cold symptoms. Wash your hands often if contact occurs. There is no clear evidence that vitamin C, vitamin E, echinacea, or exercise reduces the chance of developing a cold. However, it is always recommended to get plenty of rest and practice good nutrition. TREATMENT  Treatment is directed at relieving symptoms. There is no cure. Antibiotics are not effective, because the infection is caused by a virus, not by bacteria. Treatment may include:  Increased fluid intake. Sports drinks offer valuable electrolytes, sugars, and fluids.  Breathing heated mist or steam (vaporizer or shower).  Eating chicken soup or other clear broths, and maintaining good nutrition.  Getting plenty of rest.  Using gargles or lozenges for comfort.  Controlling fevers with ibuprofen or acetaminophen as directed by your caregiver.  Increasing usage of your inhaler if you have asthma. Zinc gel and zinc lozenges, taken in the first 24 hours of the common cold, can shorten the duration and lessen the severity of symptoms. Pain medicines may help with fever, muscle aches, and throat pain. A variety of non-prescription medicines are available to treat congestion and runny nose. Your caregiver can make recommendations and may suggest nasal or lung inhalers for other symptoms.  HOME CARE INSTRUCTIONS   Only take over-the-counter or prescription medicines for pain, discomfort, or fever as directed by your caregiver.  Use a warm mist humidifier or inhale steam from a shower to increase air moisture. This may keep secretions moist and make it easier to breathe.  Drink enough water and fluids to keep your urine clear or pale yellow.  Rest as needed.  Return to work when your temperature has returned to normal or as your caregiver advises. You may need to stay home longer to  avoid infecting others. You can also use a face mask and careful hand washing to prevent spread of the virus. SEEK MEDICAL CARE IF:   After the first few days, you feel you are getting worse rather than better.  You need your caregiver's advice about medicines to control symptoms.  You develop chills, worsening shortness of breath, or brown or red sputum. These may be signs of pneumonia.  You develop yellow or brown nasal discharge or pain in the face, especially when you bend forward. These may be signs of sinusitis.  You develop a fever, swollen neck glands, pain with swallowing, or white areas in the back of your throat. These may be signs of strep throat. SEEK IMMEDIATE MEDICAL CARE IF:   You have a fever.  You develop severe or persistent headache, ear pain, sinus pain, or chest pain.  You develop wheezing, a prolonged cough, cough up blood, or have a change in your usual mucus (if you have chronic lung disease).  You develop sore muscles or a stiff neck. Document Released: 07/31/2000 Document Revised: 04/29/2011 Document Reviewed: 05/12/2013 [facility] Patient Information 2015 [place], [place]. This information is not intended to replace advice given to you by your health care provider. Make sure you discuss any questions you have with your health care provider.

## 2014-09-23 NOTE — ED Notes (Signed)
No problem keeping fluids down.

## 2014-09-23 NOTE — ED Provider Notes (Signed)
CSN: [id number]     Arrival date & time 09/23/14  1435 History  This chart was scribed for non-physician practitioner Jaynie Crumble, PA-C working with Melene Plan, DO by Littie Deeds, ED Scribe. This patient was seen in room TR01C/TR01C and the patient's care was started at 3:43 PM.     Chief Complaint  Patient presents with  . Cough  . Generalized Body Aches   The history is provided by the patient. No language interpreter was used.    HPI Comments: Nicole Rose is a 24 y.o. female who presents to the Emergency Department complaining of gradual onset cough that started yesterday. Patient reports having associated chest pain, abdominal pain and back pain when coughing. She also reports having loss of appetite and lightheadedness if she stands too long. She had diarrhea yesterday and had 5-6 episodes of vomiting between 8:00 PM last night and this morning. She did take some vitamin C and some cough OTC medication. Patient denies rhinorrhea, sore throat, and urinary symptoms. She has not had anything to eat or drink today. She has had sick contacts. Patient admits to smoking on most days. She notes that she is currently on her menstrual period.    Past Medical History  Diagnosis Date  . Headache(784.0)     migraine   Past Surgical History  Procedure Laterality Date  . Breast surgery Right 2/14    abscess excise  . Breast biopsy Right 09/28/2012    Procedure: EXCISION OF CHRONIC RIGHT BREAST ABSCESS;  Surgeon: Almond Lint, MD;  Location: [facility] OR;  Service: General;  Laterality: Right;   Family History  Problem Relation Age of Onset  . Cancer Maternal Aunt     breast  . Cancer Paternal Aunt     lung  . Cancer Maternal Aunt     breast  . Cancer Maternal Aunt     ovarian  . Cancer Maternal Aunt     ovarian   History  Substance Use Topics  . Smoking status: Current Some Day Smoker -- 0.50 packs/day for 2 years    Types: Cigarettes  . Smokeless tobacco: Never Used  . Alcohol  Use: Yes   OB History    No data available     Review of Systems  Constitutional: Positive for appetite change.  HENT: Negative for rhinorrhea and sore throat.   Respiratory: Positive for cough.   Cardiovascular: Positive for chest pain.  Gastrointestinal: Positive for vomiting, abdominal pain and diarrhea.  Genitourinary: Negative for dysuria, urgency, frequency, hematuria, decreased urine volume, enuresis and difficulty urinating.  Musculoskeletal: Positive for myalgias and back pain.  Neurological: Positive for light-headedness.      Allergies  Latex  Home Medications   Prior to Admission medications   Medication Sig Start Date End Date Taking? Authorizing Provider  ibuprofen (ADVIL,MOTRIN) 200 MG tablet Take 200 mg by mouth every 6 (six) hours as needed for fever or moderate pain.    Historical Provider, MD  metroNIDAZOLE (FLAGYL) 500 MG tablet Take 1 tablet (500 mg total) by mouth 2 (two) times daily. One po bid x 7 days 07/04/14   Mercedes Camprubi-Soms, PA-C  naproxen (NAPROSYN) 500 MG tablet Take 1 tablet (500 mg total) by mouth 2 (two) times daily as needed for mild pain, moderate pain or headache (TAKE WITH MEALS.). 07/04/14   Mercedes Camprubi-Soms, PA-C  promethazine (PHENERGAN) 25 MG tablet Take 1 tablet (25 mg total) by mouth every 6 (six) hours as needed for nausea or vomiting. Patient  not taking: Reported on 07/04/2014 10/15/13   Arby Barrette, MD   BP 125/71 mmHg  Pulse 90  Temp(Src) 98.8 F (37.1 C) (Oral)  Resp 16  Ht 5\' 8"  (1.727 m)  Wt 202 lb (91.627 kg)  BMI 30.72 kg/m2  SpO2 98%  LMP 09/22/2014 Physical Exam  Constitutional: She is oriented to person, place, and time. She appears well-developed and well-nourished. No distress.  HENT:  Head: Normocephalic and atraumatic.  Mouth/Throat: Oropharynx is clear and moist. No oropharyngeal exudate.  Eyes: Conjunctivae are normal. Pupils are equal, round, and reactive to light.  Neck: Normal range of motion.  Neck supple.  Cardiovascular: Normal rate, regular rhythm and normal heart sounds.   Pulmonary/Chest: Effort normal. No respiratory distress. She has wheezes. She has no rales.  Musculoskeletal: She exhibits no edema.  Neurological: She is alert and oriented to person, place, and time. No cranial nerve deficit.  Skin: Skin is warm and dry. No rash noted.  Psychiatric: She has a normal mood and affect. Her behavior is normal.  Nursing note and vitals reviewed.   ED Course  Procedures  DIAGNOSTIC STUDIES: Oxygen Saturation is 98% on room air, normal by my interpretation.    COORDINATION OF CARE: 3:49 PM-Discussed treatment plan which includes medications, CXR and labs with patient/guardian at bedside and patient/guardian agreed to plan.    Labs Review Labs Reviewed  URINALYSIS, ROUTINE W REFLEX MICROSCOPIC (NOT AT [facility])  POC URINE PREG, ED    Imaging Review No results found.   EKG Interpretation None      MDM   Final diagnoses:  Bronchitis  Non-intractable vomiting with nausea, vomiting of unspecified type    patient with cough, nausea, vomiting, diarrhea. States cough is what bothers her the most. Lungs with mild expiratory wheezes, patient is a smoker, discussed smoking cessation. Will get a chest x-ray to rule out pneumonia. Urinalysis and pregnancy test. Will try inhaler, Tussionex, Zofran for nausea.  4:43 PM X-rays negative. Will try by mouth challenge at this time. Urinalysis pending. Discussed with PA Allena Katz prior to shift change.  Filed Vitals:   09/23/14 1451 09/23/14 1558  BP: 125/71 122/74  Pulse: 90 87  Temp: 98.8 F (37.1 C) 98.9 F (37.2 C)  TempSrc: Oral Oral  Resp: 16 16  Height: 5\' 8"  (1.727 m)   Weight: 202 lb (91.627 kg)   SpO2: 98% 100%   I personally performed the services described in this documentation, which was scribed in my presence. The recorded information has been reviewed and is accurate.    Jaynie Crumble, PA-C 09/24/14  1612  Melene Plan, DO 09/25/14 1456

## 2014-09-23 NOTE — ED Notes (Signed)
Pt reports having cough with bodyaches since yesterday with mild n/v/d. Denies fever.

## 2014-09-23 NOTE — ED Provider Notes (Signed)
  Physical Exam  BP 122/74 mmHg  Pulse 87  Temp(Src) 98.9 F (37.2 C) (Oral)  Resp 16  Ht  (1.727 m)  Wt 202 lb (91.627 kg)  BMI 30.72 kg/m2  SpO2 100%  LMP 09/22/2014  Physical Exam  ED Course  Procedures Patient initially presented with cough, and nausea.  This patient was signed out to me by Jaynie Crumble, PA-C.  The plan was to discharge the patient home pending UA with zofran and cough medication.  It was thought to be a viral illness.   Recheck:  Vitals remained stable. Patient is sitting comfortably on the phone.  I discussed giving her zofran and tessalon as well as return precautions. She is agreeable with the plan. Patient was discharged with no questions or concerns.      Catha Gosselin, PA-C 09/24/14 0251  Melene Plan, DO 09/25/14 1456

## 2015-01-15 ENCOUNTER — Encounter (HOSPITAL_COMMUNITY): Payer: Self-pay | Admitting: Emergency Medicine

## 2015-01-15 ENCOUNTER — Emergency Department (HOSPITAL_COMMUNITY)
Admission: EM | Admit: 2015-01-15 | Discharge: 2015-01-15 | Discharge status: Against Medical Advice | Payer: Self-pay | Attending: Emergency Medicine | Admitting: Emergency Medicine

## 2015-01-15 DIAGNOSIS — R112 Nausea with vomiting, unspecified: Secondary | ICD-10-CM | POA: Insufficient documentation

## 2015-01-15 DIAGNOSIS — R1030 Lower abdominal pain, unspecified: Secondary | ICD-10-CM | POA: Insufficient documentation

## 2015-01-15 DIAGNOSIS — R197 Diarrhea, unspecified: Secondary | ICD-10-CM | POA: Insufficient documentation

## 2015-01-15 DIAGNOSIS — F17210 Nicotine dependence, cigarettes, uncomplicated: Secondary | ICD-10-CM | POA: Insufficient documentation

## 2015-01-15 DIAGNOSIS — Z5321 Procedure and treatment not carried out due to patient leaving prior to being seen by health care provider: Secondary | ICD-10-CM

## 2015-01-15 LAB — COMPREHENSIVE METABOLIC PANEL
ALK PHOS: 55 U/L (ref 38–126)
ALT: 11 U/L — ABNORMAL LOW (ref 14–54)
ANION GAP: 8 (ref 5–15)
AST: 17 U/L (ref 15–41)
Albumin: 3.9 g/dL (ref 3.5–5.0)
BUN: 6 mg/dL (ref 6–20)
CALCIUM: 9.4 mg/dL (ref 8.9–10.3)
CHLORIDE: 107 mmol/L (ref 101–111)
CO2: 23 mmol/L (ref 22–32)
Creatinine, Ser: 0.64 mg/dL (ref 0.44–1.00)
GFR calc non Af Amer: >60 mL/min (ref >60)
Glucose, Bld: 92 mg/dL (ref 65–99)
Potassium: 3.9 mmol/L (ref 3.5–5.1)
SODIUM: 138 mmol/L (ref 135–145)
Total Bilirubin: <0.1 mg/dL — ABNORMAL LOW (ref 0.3–1.2)
Total Protein: 7 g/dL (ref 6.5–8.1)

## 2015-01-15 LAB — CBC
HCT: 37.4 % (ref 36.0–46.0)
Hemoglobin: 11.8 g/dL — ABNORMAL LOW (ref 12.0–15.0)
MCH: 28.3 pg (ref 26.0–34.0)
MCHC: 31.6 g/dL (ref 30.0–36.0)
MCV: 89.7 fL (ref 78.0–100.0)
Platelets: 302 K/uL (ref 150–400)
RBC: 4.17 MIL/uL (ref 3.87–5.11)
RDW: 14.3 % (ref 11.5–15.5)
WBC: 9.1 K/uL (ref 4.0–10.5)

## 2015-01-15 LAB — LIPASE, BLOOD: Lipase: 21 U/L (ref 11–51)

## 2015-01-15 NOTE — ED Notes (Signed)
Pt states that she has been having low abd cramping, NVD.  LMP was in October but "doesn't remember" if she missed her period this month.

## 2015-01-15 NOTE — ED Notes (Signed)
Attempted to call patient, no answer 

## 2015-01-15 NOTE — ED Notes (Signed)
Attempted to call patient for the third time, no answer. Removing patient from tracking board

## 2015-01-16 ENCOUNTER — Encounter (HOSPITAL_COMMUNITY): Payer: Self-pay | Admitting: Emergency Medicine

## 2015-01-16 ENCOUNTER — Emergency Department (HOSPITAL_COMMUNITY)
Admission: EM | Admit: 2015-01-16 | Discharge: 2015-01-16 | Disposition: A | Discharge status: Home / Self Care | Payer: Self-pay | Attending: Emergency Medicine | Admitting: Emergency Medicine

## 2015-01-16 DIAGNOSIS — F17210 Nicotine dependence, cigarettes, uncomplicated: Secondary | ICD-10-CM | POA: Insufficient documentation

## 2015-01-16 DIAGNOSIS — Z3A01 Less than 8 weeks gestation of pregnancy: Secondary | ICD-10-CM | POA: Insufficient documentation

## 2015-01-16 DIAGNOSIS — Z8679 Personal history of other diseases of the circulatory system: Secondary | ICD-10-CM | POA: Insufficient documentation

## 2015-01-16 DIAGNOSIS — O99331 Smoking (tobacco) complicating pregnancy, first trimester: Secondary | ICD-10-CM | POA: Insufficient documentation

## 2015-01-16 DIAGNOSIS — O210 Mild hyperemesis gravidarum: Secondary | ICD-10-CM | POA: Insufficient documentation

## 2015-01-16 DIAGNOSIS — O219 Vomiting of pregnancy, unspecified: Secondary | ICD-10-CM

## 2015-01-16 DIAGNOSIS — Z91040 Latex allergy status: Secondary | ICD-10-CM | POA: Insufficient documentation

## 2015-01-16 LAB — HCG, QUANTITATIVE, PREGNANCY: hCG, Beta Chain, Quant, S: 128545 m[IU]/mL — ABNORMAL HIGH (ref <5)

## 2015-01-16 MED ORDER — ONDANSETRON HCL 4 MG PO TABS: 4.0000 mg | ORAL_TABLET | Freq: Four times a day (QID) | ORAL | Status: DC | PRN

## 2015-01-16 MED ORDER — ONDANSETRON 8 MG PO TBDP
8.0000 mg | ORAL_TABLET | Freq: Once | ORAL | Status: AC
Start: 2015-01-16 — End: 2015-01-16
  Administered 2015-01-16: 8 mg via ORAL
  Filled 2015-01-16: qty 1

## 2015-01-16 NOTE — ED Notes (Signed)
Pt states that she was seen here yesterday and took pregnancy test bc she has missed some periods and it was positive.  Pt states that she doesn't want to keep the baby and has "talked to some people about it, but i cant keep anything down, i am constantly throwing up".  Pt denies any pain.

## 2015-01-16 NOTE — Progress Notes (Addendum)
[facility] Call on 01/17/2015 [facility]  [address]  [place], [place] [address].  [phone]  Reviewed the above resource with the pt and encouraged use of goodrx, health dept, DSS Pt with 3 ED visits in the last 6 months  CM spoke with pt who [place]cp.  CM discussed and provided written information for uninsured accepting pcps, discussed the importance of pcp vs EDP services for f/u care, www.needymeds.org, www.goodrx.com, discounted pharmacies and other [place] resources such as [facility] , [facility], affordable care act, financial assistance, uninsured dental services, [place] med assist, DSS and  health department  Reviewed resources for [place] uninsured accepting pcps like [facility], family medicine at [address], [facility], [facility], local urgent care centers, [facility], [facility], general medical clinics, family services of the [place], [facility], [facility] care plus others, medication resources, [facility] out patient pharmacies and housing Pt voiced understanding and appreciation of resources provided   Provided [facility] contact information Pt agreed to a referral Cm completed referral Pt to be contact by [facility] clinical liason  Please use the resources provided to you in emergency room by case manager to assist with doctor for follow up On 02/16/2015 A referral for you has been sent to [facility] if you have not received a call in 3 days you may contact them Call Scherry RanKaren Andrianos at [phone] Tuesday-Friday [email or web address] These [place] uninsured resources provide possible primary care providers, resources for discounted medications, housing, dental resources, affordable care act information, plus other resources for [place]

## 2015-01-16 NOTE — Discharge Instructions (Signed)
1. Medications: Zofran as needed for nausea/vomiting 2. Treatment: rest, drink plenty of fluids 3. Follow Up: Please follow up with the women's health clinic listed above as soon as you can for discussion of your diagnoses and further evaluation after today's visit; Please return to the ER for any new or worsening symptoms, any additional concerns.   Morning Sickness Morning sickness is when you feel sick to your stomach (nauseous) during pregnancy. You may feel sick to your stomach and throw up (vomit). You may feel sick in the morning, but you can feel this way any time of day. Some women feel very sick to their stomach and cannot stop throwing up (hyperemesis gravidarum). HOME CARE  Only take medicines as told by your doctor.  Take multivitamins as told by your doctor. Taking multivitamins before getting pregnant can stop or lessen the harshness of morning sickness.  Eat dry toast or unsalted crackers before getting out of bed.  Eat 5 to 6 small meals a day.  Eat dry and bland foods like rice and baked potatoes.  Do not drink liquids with meals. Drink between meals.  Do not eat greasy, fatty, or spicy foods.  Have someone cook for you if the smell of food causes you to feel sick or throw up.  If you feel sick to your stomach after taking prenatal vitamins, take them at night or with a snack.  Eat protein when you need a snack (nuts, yogurt, cheese).  Eat unsweetened gelatins for dessert.  Wear a bracelet used for sea sickness (acupressure wristband).  Go to a doctor that puts thin needles into certain body points (acupuncture) to improve how you feel.  Do not smoke.  Use a humidifier to keep the air in your house free of odors.  Get lots of fresh air. GET HELP IF:  You need medicine to feel better.  You feel dizzy or lightheaded.  You are losing weight. GET HELP RIGHT AWAY IF:   You feel very sick to your stomach and cannot stop throwing up.  You pass out  (faint). MAKE SURE YOU:  Understand these instructions.  Will watch your condition.  Will get help right away if you are not doing well or get worse.   This information is not intended to replace advice given to you by your health care provider. Make sure you discuss any questions you have with your health care provider.   Document Released: 03/14/2004 Document Revised: 02/25/2014 Document Reviewed: 07/22/2012 Elsevier Interactive Patient Education [employer]2016 Elsevier Inc.

## 2015-01-16 NOTE — ED Provider Notes (Signed)
CSN: [id number]     Arrival date & time 01/16/15  1446 History   First MD Initiated Contact with Patient 01/16/15 1752     Chief Complaint  Patient presents with  . Morning Sickness     (Consider location/radiation/quality/duration/timing/severity/associated sxs/prior Treatment) The history is provided by the patient and medical records. No language interpreter was used.   Nicole Rose is a 24 y.o. female  with a PMH of migraines who presents to the Emergency Department complaining of persistent nausea and NBNB emesis x 5-6 days. Able to eat something this afternoon, but prior to that, was not tolerating PO well. Patient was here yesterday for this issue, but left without being seen. States she went home and took a pregnancy test which was positive. LMP ~ 10/10-13th. Denies associated symptoms. No vaginal bleeding, no vaginal discharge.    Past Medical History  Diagnosis Date  . Headache(784.0)     migraine   Past Surgical History  Procedure Laterality Date  . Breast surgery Right 2/14    abscess excise  . Breast biopsy Right 09/28/2012    Procedure: EXCISION OF CHRONIC RIGHT BREAST ABSCESS;  Surgeon: Almond LintFaera Byerly, MD;  Location: [facility] OR;  Service: General;  Laterality: Right;   Family History  Problem Relation Age of Onset  . Cancer Maternal Aunt     breast  . Cancer Paternal Aunt     lung  . Cancer Maternal Aunt     breast  . Cancer Maternal Aunt     ovarian  . Cancer Maternal Aunt     ovarian   Social History  Substance Use Topics  . Smoking status: Current Some Day Smoker -- 0.50 packs/day for 2 years    Types: Cigarettes  . Smokeless tobacco: Never Used  . Alcohol Use: Yes   OB History    No data available     Review of Systems  Constitutional: Negative.   HENT: Negative for congestion, rhinorrhea and sore throat.   Eyes: Negative for visual disturbance.  Respiratory: Negative for cough, shortness of breath and wheezing.   Cardiovascular: Negative.    Gastrointestinal: Positive for nausea and vomiting. Negative for abdominal pain, diarrhea and constipation.  Genitourinary: Negative for dysuria, vaginal bleeding and vaginal discharge.  Musculoskeletal: Negative for myalgias, back pain, arthralgias and neck pain.  Skin: Negative for rash.  Neurological: Negative for dizziness, weakness and headaches.      Allergies  Latex  Home Medications   Prior to Admission medications   Medication Sig Start Date End Date Taking? Authorizing Provider  ondansetron (ZOFRAN) 4 MG tablet Take 1 tablet (4 mg total) by mouth every 6 (six) hours as needed for nausea or vomiting. 01/16/15   Jaime Pilcher Ward, PA-C   BP 131/70 mmHg  Pulse 99  Temp(Src) 97.6 F (36.4 C) (Oral)  Resp 16  SpO2 98%  LMP 12/01/2014 (Approximate) Physical Exam  Constitutional: She is oriented to person, place, and time. She appears well-developed and well-nourished.  Alert and in no acute distress  HENT:  Head: Normocephalic and atraumatic.  Cardiovascular: Normal rate, regular rhythm, normal heart sounds and intact distal pulses.  Exam reveals no gallop and no friction rub.   No murmur heard. Pulmonary/Chest: Effort normal and breath sounds normal. No respiratory distress. She has no wheezes. She has no rales. She exhibits no tenderness.  Abdominal: She exhibits no mass. There is no rebound and no guarding.  Abdomen soft, non-tender, non-distended Bowel sounds positive in all four quadrants  Musculoskeletal: She exhibits no edema.  Neurological: She is alert and oriented to person, place, and time.  Skin: Skin is warm and dry. No rash noted.  Psychiatric: She has a normal mood and affect. Her behavior is normal. Judgment and thought content normal.  Nursing note and vitals reviewed.   ED Course  Procedures (including critical care time) Labs Review Labs Reviewed  HCG, QUANTITATIVE, PREGNANCY - Abnormal; Notable for the following:    hCG, Beta Chain, [name]  [id number] (*)    All other components within normal limits    Imaging Review No results found. I have personally reviewed and evaluated these images and lab results as part of my medical decision-making.   EKG Interpretation None      MDM   Final diagnoses:  Nausea/vomiting in pregnancy   Nicole Rose presents with persistent n/v and + home pregnancy test.  Will give zofran here. Patient requesting pregnancy test here to "make sure I'm really pregnant" - I told her with her LMP > 6 weeks ago and positive home pregnancy test, home test results are likely accurate but will obtain beta hcg to confirm and give her some idea of how far along she is. Told her this is just an estimate, and she will need OB f/up for better estimation.  Hcg + with value of [id number]  7:51 PM - Patient feels improved, ready for discharge, all questions answered.  Will dc to home with OBGYN follow up and zofran rx   [facility] Ward, PA-C 01/16/15 1952  Arby Barrette, MD 01/18/15 [phone]

## 2015-05-14 ENCOUNTER — Emergency Department (HOSPITAL_COMMUNITY)
Admission: EM | Admit: 2015-05-14 | Discharge: 2015-05-14 | Disposition: A | Discharge status: Home / Self Care | Payer: Self-pay | Attending: Emergency Medicine | Admitting: Emergency Medicine

## 2015-05-14 ENCOUNTER — Encounter (HOSPITAL_COMMUNITY): Payer: Self-pay | Admitting: Emergency Medicine

## 2015-05-14 DIAGNOSIS — F17210 Nicotine dependence, cigarettes, uncomplicated: Secondary | ICD-10-CM | POA: Insufficient documentation

## 2015-05-14 DIAGNOSIS — Z91040 Latex allergy status: Secondary | ICD-10-CM | POA: Insufficient documentation

## 2015-05-14 DIAGNOSIS — L0231 Cutaneous abscess of buttock: Secondary | ICD-10-CM | POA: Insufficient documentation

## 2015-05-14 DIAGNOSIS — L0291 Cutaneous abscess, unspecified: Secondary | ICD-10-CM

## 2015-05-14 DIAGNOSIS — Z23 Encounter for immunization: Secondary | ICD-10-CM | POA: Insufficient documentation

## 2015-05-14 DIAGNOSIS — Z8679 Personal history of other diseases of the circulatory system: Secondary | ICD-10-CM | POA: Insufficient documentation

## 2015-05-14 MED ORDER — OXYCODONE-ACETAMINOPHEN 5-325 MG PO TABS
1.0000 | ORAL_TABLET | Freq: Once | ORAL | Status: AC
  Administered 2015-05-14: 1 via ORAL
  Filled 2015-05-14: qty 1

## 2015-05-14 MED ORDER — TETANUS-DIPHTH-ACELL PERTUSSIS 5-2.5-18.5 LF-MCG/0.5 IM SUSP
0.5000 mL | Freq: Once | INTRAMUSCULAR | Status: AC
  Administered 2015-05-14: 0.5 mL via INTRAMUSCULAR
  Filled 2015-05-14: qty 0.5

## 2015-05-14 MED ORDER — SULFAMETHOXAZOLE-TRIMETHOPRIM 800-160 MG PO TABS: 1.0000 | ORAL_TABLET | Freq: Two times a day (BID) | ORAL | Status: AC

## 2015-05-14 MED ORDER — NAPROXEN 500 MG PO TABS: 500.0000 mg | ORAL_TABLET | Freq: Two times a day (BID) | ORAL | Status: DC

## 2015-05-14 MED ORDER — LIDOCAINE-EPINEPHRINE (PF) 2 %-1:200000 IJ SOLN
10.0000 mL | Freq: Once | INTRAMUSCULAR | Status: AC
  Administered 2015-05-14: 10 mL
  Filled 2015-05-14: qty 20

## 2015-05-14 MED ORDER — OXYCODONE-ACETAMINOPHEN 5-325 MG PO TABS: 1.0000 | ORAL_TABLET | ORAL | Status: DC | PRN

## 2015-05-14 NOTE — ED Notes (Signed)
Pt c/o abscess to left buttock area x 3-4 days ago. Pt denies drainage at this time.

## 2015-05-14 NOTE — ED Notes (Signed)
Declined W/C at D/C and was escorted to lobby by RN. 

## 2015-05-14 NOTE — ED Provider Notes (Signed)
CSN: [id number]     Arrival date & time 05/14/15  1122 History  By signing my name below, I, Tanda Rockers, attest that this documentation has been prepared under the direction and in the presence of Shawn Joy, PA-C. Electronically Signed: Tanda Rockers, ED Scribe. 05/14/2015. 12:20 PM.   Chief Complaint  Patient presents with  . Abscess   The history is provided by the patient. No language interpreter was used.     HPI Comments: Nicole Rose is a 25 y.o. female who presents to the Emergency Department complaining of gradual onset, constant, area of redness, swelling, and pain to the left buttocks x 3 days. Pt has never had symptoms like this in the past. Patient has not taken any medications for her symptoms. Denies fever/chills, nausea, vomiting, abdominal pain, or any other associated symptoms.    Past Medical History  Diagnosis Date  . Headache(784.0)     migraine   Past Surgical History  Procedure Laterality Date  . Breast surgery Right 2/14    abscess excise  . Breast biopsy Right 09/28/2012    Procedure: EXCISION OF CHRONIC RIGHT BREAST ABSCESS;  Surgeon: Almond Lint, MD;  Location: [facility] OR;  Service: General;  Laterality: Right;   Family History  Problem Relation Age of Onset  . Cancer Maternal Aunt     breast  . Cancer Paternal Aunt     lung  . Cancer Maternal Aunt     breast  . Cancer Maternal Aunt     ovarian  . Cancer Maternal Aunt     ovarian   Social History  Substance Use Topics  . Smoking status: Current Some Day Smoker -- 0.50 packs/day for 2 years    Types: Cigarettes  . Smokeless tobacco: Never Used  . Alcohol Use: Yes   OB History    No data available     Review of Systems  Constitutional: Negative for fever.  Gastrointestinal: Negative for nausea, vomiting and abdominal pain.  Skin:       + abscess to left buttocks   Allergies  Latex  Home Medications   Prior to Admission medications   Medication Sig Start Date End Date Taking?  Authorizing Provider  naproxen (NAPROSYN) 500 MG tablet Take 1 tablet (500 mg total) by mouth 2 (two) times daily. 05/14/15   Shawn C Joy, PA-C  ondansetron (ZOFRAN) 4 MG tablet Take 1 tablet (4 mg total) by mouth every 6 (six) hours as needed for nausea or vomiting. 01/16/15   Chase Picket Ward, PA-C  oxyCODONE-acetaminophen (PERCOCET/ROXICET) 5-325 MG tablet Take 1 tablet by mouth every 4 (four) hours as needed for severe pain. 05/14/15   Shawn C Joy, PA-C  sulfamethoxazole-trimethoprim (BACTRIM DS,SEPTRA DS) 800-160 MG tablet Take 1 tablet by mouth 2 (two) times daily. 05/14/15 05/21/15  Shawn C Joy, PA-C   BP 101/58 mmHg  Pulse 62  Temp(Src) 98.4 F (36.9 C) (Oral)  Resp 18  Ht  (1.702 m)  Wt 93.98 kg  BMI 32.44 kg/m2  SpO2 99%  LMP 05/02/2015   Physical Exam  Constitutional: She is oriented to person, place, and time. She appears well-developed and well-nourished. No distress.  HENT:  Head: Normocephalic and atraumatic.  Eyes: Conjunctivae are normal.  Neck: Neck supple.  Cardiovascular: Normal rate and regular rhythm.   Pulmonary/Chest: Effort normal. No respiratory distress.  Musculoskeletal: She exhibits no edema.  Neurological: She is alert and oriented to person, place, and time.  Skin: Skin is warm and  dry. She is not diaphoretic.  Erythematous fluctuant mass about 5 cm x 3 cm on the left gluteus.   Psychiatric: She has a normal mood and affect. Her behavior is normal.  Nursing note and vitals reviewed.   ED Course  .Marland Kitchen.Incision and Drainage Date/Time: 05/14/2015 12:23 PM Performed by: Anselm PancoastJOY, SHAWN C Authorized by: Harolyn RutherfordJOY, SHAWN C Consent: Verbal consent obtained. Risks and benefits: risks, benefits and alternatives were discussed Consent given by: patient Patient understanding: patient states understanding of the procedure being performed Patient consent: the patient's understanding of the procedure matches consent given Procedure consent: procedure consent matches  procedure scheduled Patient identity confirmed: verbally with patient and arm band Type: abscess Body area: anogenital Location details: gluteal cleft Anesthesia: local infiltration Local anesthetic: lidocaine 2% with epinephrine Anesthetic total: 3 ml Patient sedated: no Scalpel size: 11 Incision type: single straight Incision depth: subcutaneous Complexity: simple Drainage: purulent and  bloody Drainage amount: copious Wound treatment: wound left open Packing material: none Patient tolerance: Patient tolerated the procedure well with no immediate complications    EMERGENCY DEPARTMENT US SOFT TISSUE INTERPRETATION "Study: Limited Ultrasound of the noted body part in comments below"  INDICATIONS: Soft tissue infection Multiple views of the body part are obtained with a multi-frequency linear probe  PERFORMED BY:  Myself  IMAGES ARCHIVED?: Yes  SIDE:Left  BODY PART:Other soft tisse (comment in note)  FINDINGS: Abcess present and Cellulitis present  LIMITATIONS:  None  INTERPRETATION:  Abcess present and Cellulitis present  COMMENT:  Ultrasound was used to assess the depth of the abscess, identify anything nearby vital structures, and assess for cellulitis. The abscess was located in the left gluteus.   (including critical care time)  DIAGNOSTIC STUDIES: Oxygen Saturation is 100% on RA, normal by my interpretation.    COORDINATION OF CARE: 12:20 PM-Discussed treatment plan which includes I&D with pt at bedside and pt agreed to plan.     MDM   Final diagnoses:  Abscess   Nicole Rose presents with an abscess in the left gluteal crease for the last 3-4 days.  Patient has no systemic symptoms. Abscess was suspected and subsequently identified with ultrasound. Some surrounding cellulitis present. Abscess was successfully drained. Antibiotic prescribed. Patient was advised to return to the ED or go to a PCP for wound check in 3 days. Home care and return  precautions discussed. Patient voiced understanding of these instructions and is comfortable with discharge.  Filed Vitals:   05/14/15 1128 05/14/15 1347  BP: 102/69 101/58  Pulse: 110 62  Temp: 97.9 F (36.6 C) 98.4 F (36.9 C)  TempSrc: Oral Oral  Resp: 14 18  Height: 5\' 7"  (1.702 m)   Weight: 93.98 kg   SpO2: 100% 99%     I personally performed the services described in this documentation, which was scribed in my presence. The recorded information has been reviewed and is accurate.      Anselm PancoastShawn C Joy, PA-C 05/16/15 1421  Laurence Spatesachel Morgan Little, MD 05/17/15 [phone]

## 2015-05-14 NOTE — Discharge Instructions (Signed)
You have been seen today for an abscess. Return to the ED in 3 days for wound check. Please take all of your antibiotics until finished!   You may develop abdominal discomfort or diarrhea from the antibiotic.  You may help offset this with probiotics which you can buy or get in yogurt. Do not eat or take the probiotics until 2 hours after your antibiotic. Follow up with PCP as needed. Return to ED should symptoms worsen.  RESOURCE GUIDE  Chronic Pain Problems: Contact [facility]  [phone] Patients need to be referred by their primary care doctor.  Insufficient Money for Medicine: Contact United Way:  call "211" or [facility] 3190897345.  No Primary Care Doctor: - Call [facility]  [phone] - can help you locate a primary care doctor that  accepts your insurance, provides certain services, etc. - Physician Referral Service- [phone]  Agencies that provide inexpensive medical care: - [facility]  [phone] - [facility]  [phone] - [facility]  [phone] - Women's Clinic  [phone] - Planned Parenthood  [phone] [facility]  [phone]  Medicaid-accepting [facility] Providers: - [facility]- [address]  [phone], Mon-Fri 9am-7pm, Sat 9am-1pm - [facility]- [address] [place], [address] [place]  [phone] - [facility]- [address] [place]  [phone] [facility]- [address]  [phone] - Renaye Rakers- [address] [place], [address], [phone]  Only accepts [place] patients after they have their name  applied to their card  Self Pay (no insurance) in [place]: - Sickle Cell Patients: Dr Willey Blade, [facility]  [address] [place], [phone] - [facility]- [address] [place]  [phone]       [facility]  [place]- [address]       -     [facility]- see information above (Speak to [employer] if you do not have insurance)       -  [facility]- [address] [place], [phone]       -  [facility]- [address],  [phone]       -  [facility]- [address], [phone]       -  Dr Julio Sicks-  [address], [place], [phone]       -  [facility]- [address], [phone]       -  [facility]- [address], [phone], also [address], [phone]       -    [facility]- [address] [place], [phone], 1st & 3rd Saturday   every month, 10am-1pm  1) Find a Doctor and Pay Out of Pocket Although you won't have to find out who is covered by your insurance plan, it is a good idea to ask around and get recommendations. You will then need to call the office and see if the doctor you have chosen will accept you as a new patient and what types of options they offer for patients who are self-pay. Some doctors offer discounts or will set up payment plans for their patients who do not have insurance, but you will need to ask so  you aren't surprised when you get to your appointment.  2) Contact Your Local Health Department Not all health departments have doctors that can see patients for sick visits, but many do, so it is worth a call to see if yours does. If you don't know where your local health department is, you can check in your phone book. The CDC also has a tool to help you locate your state's health department, and many state websites also have listings of all of their local health departments.  3) Find a Walk-in Clinic If your illness is not likely to be very severe or complicated, you may want to try a walk in clinic. These are popping up all over the country in pharmacies, drugstores, and shopping centers. They're usually staffed by nurse practitioners or physician assistants that have  been trained to treat common illnesses and complaints. They're usually fairly quick and inexpensive. However, if you have serious medical issues or chronic medical problems, these are probably not your best option  STD Testing - [facility] of [facility], STD Clinic, [address] [place], phone [phone] or [phone].  Monday - Friday, call for an appointment. [facility] Department of [employer], STD Clinic, [address], [place], phone [phone] or [phone].  Monday - Friday, call for an appointment.  Abuse/Neglect: [facility] Child Abuse Hotline 620-334-1105 [facility] Child Abuse Hotline 520-746-4706 (After Hours)  Emergency Shelter:  [facility] 512-707-0647  Maternity Homes: - [facility] (608)[phone] - [facility] 501-877-4331  MRSA Hotline #:   8672645605  [facility] Resources  [facility] [place]  United Way [facility] Suite Dept. [address]                 [address]         [address] [place] [address]  Blondell Reveal Phone:  [phone]                                  Phone:  [phone]) [phone]                   Phone:  [phone]  [facility], [phone] - [facility] - [phone]       -     [facility] in [place], [address]                                  [phone], [facility] Child Abuse Hotline 519-552-5667 or (267) 547-9220 (After Hours)   Behavioral Health Services  Substance Abuse Resources: - [facility] Drug [facility]  (740)654-9174 - [facility] [phone] - [facility] [phone] Floydene Flock [phone] - Residential & Outpatient Substance Abuse Program  [phone]  Psychological Services: Tressie Ellis- [facility]  [phone] - [facility]  [phone] - [facility], [place], [place], ACCESS LINE: ([phone] or [phone], [email or web address]  Dental Assistance  If unable to pay or uninsured, contact:  [facility] or [facility] to become qualified for the adult dental clinic.  Patients with Medicaid: [facility]  Dental [phone], [phone] [address], [phone]  If unable to pay, or uninsured, contact [facility] [phone]) or [facility] [phone] in [place], [phone] in [facility]) to become qualified for the adult dental clinic   Other Low-Cost Community Dental Services: - [facility]- [address], [place], [place], [phone], [phone], [phone], 2nd and 4th Thursday of the month at 6:30am.  10 clients each day by appointment, can sometimes see walk-in patients if someone does not show for an appointment. [facility]- [address], [place], [place], [address], [phone] - [facility]- [address], [place], [place], [address], [phone] - [facility]- [phone] [facility]- [facility]- [phone] [facility]- [phone]

## 2016-03-13 ENCOUNTER — Emergency Department (HOSPITAL_COMMUNITY)
Admission: EM | Admit: 2016-03-13 | Discharge: 2016-03-13 | Disposition: A | Discharge status: Home / Self Care | Payer: Self-pay | Attending: Emergency Medicine | Admitting: Emergency Medicine

## 2016-03-13 ENCOUNTER — Encounter (HOSPITAL_COMMUNITY): Payer: Self-pay | Admitting: Emergency Medicine

## 2016-03-13 DIAGNOSIS — F17210 Nicotine dependence, cigarettes, uncomplicated: Secondary | ICD-10-CM | POA: Insufficient documentation

## 2016-03-13 DIAGNOSIS — N611 Abscess of the breast and nipple: Secondary | ICD-10-CM | POA: Insufficient documentation

## 2016-03-13 MED ORDER — LIDOCAINE HCL (PF) 1 % IJ SOLN
5.0000 mL | Freq: Once | INTRAMUSCULAR | Status: AC
  Administered 2016-03-13: 5 mL via INTRADERMAL
  Filled 2016-03-13: qty 5

## 2016-03-13 MED ORDER — CLINDAMYCIN HCL 150 MG PO CAPS
300.0000 mg | ORAL_CAPSULE | Freq: Three times a day (TID) | ORAL | 0 refills | Status: DC
Start: 2016-03-13 — End: 2016-10-11

## 2016-03-13 NOTE — ED Triage Notes (Signed)
Pt states "i have a lump on my right breast and now its getting irritated" Pt has red area roughly two inches in diameter under her R breast.

## 2016-03-13 NOTE — Discharge Instructions (Signed)
Take the prescribed medication as directed.  Warm soaks at home 2-3 times a day.  Keep area clean with soap and warm water. Follow-up with your primary care doctor. Return to the ED for new or worsening symptoms.

## 2016-03-13 NOTE — ED Notes (Signed)
Pt is in stable condition upon d/c and ambulates from ED. 

## 2016-03-13 NOTE — ED Provider Notes (Signed)
[facility] Provider Note   CSN: [id number] Arrival date & time: 03/13/16  1049  By signing my name below, I, Modena JanskyAlbert Thayil, attest that this documentation has been prepared under the direction and in the presence of non-physician practitioner, Sharilyn SitesLisa Emilio Baylock, PA-C. Electronically Signed: Modena JanskyAlbert Thayil, Scribe. 03/13/2016. 11:52 AM.  History   Chief Complaint Chief Complaint  Patient presents with  . Abscess   The history is provided by the patient. No language interpreter was used.   HPI Comments: Nicole Rose is a 26 y.o. female with a PMHx of abscess who presents to the Emergency Department complaining of constant moderate right breast bump that started 3 days ago. She states she noticed a sudden lump that started having associated redness today. No modifying factors. She denies any hx of DM, fever, or other complaints.   Past Medical History:  Diagnosis Date  . Headache(784.0)    migraine    Patient Active Problem List   Diagnosis Date Noted  . Failed vision screen 10/30/2012  . Failed hearing screening 10/30/2012  . General counseling and advice for contraceptive management 10/30/2012  . Sleep disturbance 10/30/2012  . Dizziness 10/30/2012  . Adjustment disorder with mixed anxiety and depressed mood 10/30/2012  . Smoker 10/23/2012  . 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 Epidermal inclusion cyst 01/10/2012    Past Surgical History:  Procedure Laterality Date  . BREAST BIOPSY Right 09/28/2012   Procedure: EXCISION OF CHRONIC RIGHT BREAST ABSCESS;  Surgeon: Almond LintFaera Byerly, MD;  [facility];  Service: General;  Laterality: Right;  . BREAST SURGERY Right 2/14   abscess excise    OB History    No data available       Home Medications    Prior to Admission medications   Medication Sig Start Date End Date Taking? Authorizing Provider  naproxen (NAPROSYN) 500 MG tablet Take 1 tablet (500 mg total) by mouth 2 (two) times daily. 05/14/15   Shawn C Joy,  PA-C  ondansetron (ZOFRAN) 4 MG tablet Take 1 tablet (4 mg total) by mouth every 6 (six) hours as needed for nausea or vomiting. 01/16/15   Chase PicketJaime Pilcher Ward, PA-C  oxyCODONE-acetaminophen (PERCOCET/ROXICET) 5-325 MG tablet Take 1 tablet by mouth every 4 (four) hours as needed for severe pain. 05/14/15   Anselm PancoastShawn C Joy, PA-C    Family History Family History  Problem Relation Age of Onset  . Cancer Maternal Aunt     breast  . Cancer Paternal Aunt     lung  . Cancer Maternal Aunt     breast  . Cancer Maternal Aunt     ovarian  . Cancer Maternal Aunt     ovarian    Social History Social History  Substance Use Topics  . Smoking status: Current Some Day Smoker    Packs/day: 0.50    Years: 2.00    Types: Cigarettes  . Smokeless tobacco: Never Used  . Alcohol use Yes     Allergies   Latex   Review of Systems Review of Systems  Constitutional: Negative for fever.  Skin: Positive for color change.       +bump  All other systems reviewed and are negative.    Physical Exam Updated Vital Signs BP 109/62 (BP Location: Right Arm)   Pulse 63   Temp 98.2 F (36.8 C) (Oral)   Resp 14   LMP 02/24/2016   SpO2 100%   Physical Exam  Constitutional: She is oriented to person, place, and  time. She appears well-developed and well-nourished.  HENT:  Head: Normocephalic and atraumatic.  Mouth/Throat: Oropharynx is clear and moist.  Eyes: Conjunctivae and EOM are normal. Pupils are equal, round, and reactive to light.  Neck: Normal range of motion.  Cardiovascular: Normal rate, regular rhythm and normal heart sounds.   Pulmonary/Chest: Effort normal and breath sounds normal.  Right inferior breast with small abscess, approx 1cm in diameter; surrounding erythema without induration, central fluctuance noted but no active drainage  Abdominal: Soft. Bowel sounds are normal.  Musculoskeletal: Normal range of motion.  Neurological: She is alert and oriented to person, place, and time.    Skin: Skin is warm and dry.  Psychiatric: She has a normal mood and affect.  Nursing note and vitals reviewed.    ED Treatments / Results  DIAGNOSTIC STUDIES: Oxygen Saturation is 100% on RA, normal by my interpretation.    COORDINATION OF CARE: 11:56 AM- Pt advised of plan for treatment and pt agrees.  Labs (all labs ordered are listed, but only abnormal results are displayed) Labs Reviewed - No data to display  EKG  EKG Interpretation None       Radiology No results found.  Procedures Procedures (including critical care time)  INCISION AND DRAINAGE Performed by: Garlon Hatchet Consent: Verbal consent obtained. Risks and benefits: risks, benefits and alternatives were discussed Type: abscess  Body area: right inferior breast  Anesthesia: local infiltration  Incision was made with a scalpel.  Local anesthetic: lidocaine 1% without epinephrine  Anesthetic total: 4 ml  Complexity: complex Blunt dissection to break up loculations  Drainage: purulent  Drainage amount: moderate  Packing material: none  Patient tolerance: Patient tolerated the procedure well with no immediate complications.     Medications Ordered in ED Medications - No data to display   Initial Impression / Assessment and Plan / ED Course  I have reviewed the triage vital signs and the nursing notes.  Pertinent labs & imaging results that were available during my care of the patient were reviewed by me and considered in my medical decision making (see chart for details).  26 year old female here with small abscess of right inferior breast. There is surrounding erythema without induration or signs of cellulitis. Central fluctuance noted without active drainage. I&D performed, patient tolerated well. Moderate amount of purulent drainage noted. Wound was cleansed and dressed. Recommended warm soaks at home. Patient does report history of recurrent breast abscesses in the past so will  cover with antibiotics.  Discussed plan with patient, she acknowledged understanding and agreed with plan of care.  Return precautions given for new or worsening symptoms.   Final Clinical Impressions(s) / ED Diagnoses   Final diagnoses:  Breast abscess    New Prescriptions New Prescriptions   No medications on file   I personally performed the services described in this documentation, which was scribed in my presence. The recorded information has been reviewed and is accurate.    Garlon Hatchet, PA-C 03/13/16 1336    Gerhard Munch, MD 03/13/16 1400

## 2016-07-20 ENCOUNTER — Emergency Department (HOSPITAL_COMMUNITY)
Admission: EM | Admit: 2016-07-20 | Discharge: 2016-07-21 | Discharge status: Against Medical Advice | Payer: Self-pay | Source: Home / Self Care

## 2016-07-21 NOTE — ED Notes (Signed)
Pt called multiple times in waiting and no response

## 2016-10-11 ENCOUNTER — Emergency Department (HOSPITAL_COMMUNITY)
Admission: EM | Admit: 2016-10-11 | Discharge: 2016-10-11 | Disposition: A | Discharge status: Home / Self Care | Payer: Self-pay | Attending: Emergency Medicine | Admitting: Emergency Medicine

## 2016-10-11 ENCOUNTER — Encounter (HOSPITAL_COMMUNITY): Payer: Self-pay | Admitting: Emergency Medicine

## 2016-10-11 DIAGNOSIS — N764 Abscess of vulva: Secondary | ICD-10-CM | POA: Insufficient documentation

## 2016-10-11 DIAGNOSIS — L0291 Cutaneous abscess, unspecified: Secondary | ICD-10-CM

## 2016-10-11 DIAGNOSIS — F17210 Nicotine dependence, cigarettes, uncomplicated: Secondary | ICD-10-CM | POA: Insufficient documentation

## 2016-10-11 MED ORDER — LIDOCAINE HCL (PF) 1 % IJ SOLN
5.0000 mL | Freq: Once | INTRAMUSCULAR | Status: AC
  Administered 2016-10-11: 5 mL via INTRADERMAL
  Filled 2016-10-11: qty 5

## 2016-10-11 MED ORDER — CLINDAMYCIN HCL 150 MG PO CAPS: 300.0000 mg | ORAL_CAPSULE | Freq: Three times a day (TID) | ORAL | 0 refills | Status: DC

## 2016-10-11 NOTE — ED Provider Notes (Signed)
[facility] Note   CSN: [id number] Arrival date & time: 10/11/16  4098     History   Chief Complaint Chief Complaint  Patient presents with  . Recurrent Skin Infections    HPI Nicole Rose is a 26 y.o. female.  The history is provided by the patient and medical records.   26 year old female with history of migraine headaches, presenting to the ED with abscess of left side of her vagina for the past 4 days. States she keeps getting boils in various areas and is not sure why. She denies any fever or chills. No drainage. No vaginal discharge or changes in urination. She has no history of diabetes, HIV, or MRSA.  Past Medical History:  Diagnosis Date  . Headache(784.0)    migraine    Patient Active Problem List   Diagnosis Date Noted  . Failed vision screen 10/30/2012  . Failed hearing screening 10/30/2012  . General counseling and advice for contraceptive management 10/30/2012  . Sleep disturbance 10/30/2012  . Dizziness 10/30/2012  . Adjustment disorder with mixed anxiety and depressed mood 10/30/2012  . Smoker 10/23/2012  . 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 Epidermal inclusion cyst 01/10/2012    Past Surgical History:  Procedure Laterality Date  . BREAST BIOPSY Right 09/28/2012   Procedure: EXCISION OF CHRONIC RIGHT BREAST ABSCESS;  Surgeon: Almond Lint, MD;  [facility];  Service: General;  Laterality: Right;  . BREAST SURGERY Right 2/14   abscess excise    OB History    No data available       Home Medications    Prior to Admission medications   Medication Sig Start Date End Date Taking? Authorizing Provider  clindamycin (CLEOCIN) 150 MG capsule Take 2 capsules (300 mg total) by mouth 3 (three) times daily. May dispense as 150mg  capsules 03/13/16   Garlon Hatchet, PA-C  naproxen (NAPROSYN) 500 MG tablet Take 1 tablet (500 mg total) by mouth 2 (two) times daily. 05/14/15   Joy, Shawn C, PA-C  ondansetron (ZOFRAN) 4 MG tablet  Take 1 tablet (4 mg total) by mouth every 6 (six) hours as needed for nausea or vomiting. 01/16/15   Ward, Chase Picket, PA-C  oxyCODONE-acetaminophen (PERCOCET/ROXICET) 5-325 MG tablet Take 1 tablet by mouth every 4 (four) hours as needed for severe pain. 05/14/15   Joy, Hillard Danker, PA-C    Family History Family History  Problem Relation Age of Onset  . Cancer Maternal Aunt        breast  . Cancer Paternal Aunt        lung  . Cancer Maternal Aunt        breast  . Cancer Maternal Aunt        ovarian  . Cancer Maternal Aunt        ovarian    Social History Social History  Substance Use Topics  . Smoking status: Current Some Day Smoker    Packs/day: 0.50    Years: 2.00    Types: Cigarettes  . Smokeless tobacco: Never Used  . Alcohol use Yes     Allergies   Latex   Review of Systems Review of Systems  Skin:       boil  All other systems reviewed and are negative.    Physical Exam Updated Vital Signs BP 122/75 (BP Location: Right Arm)   Pulse 84   Temp 98.2 F (36.8 C) (Oral)   Resp 18   Ht 5\' 7"  (1.702 m)  Wt 93.9 kg (207 lb)   SpO2 100%   BMI 32.42 kg/m   Physical Exam  Constitutional: She is oriented to person, place, and time. She appears well-developed and well-nourished.  HENT:  Head: Normocephalic and atraumatic.  Mouth/Throat: Oropharynx is clear and moist.  Eyes: Pupils are equal, round, and reactive to light. Conjunctivae and EOM are normal.  Neck: Normal range of motion.  Cardiovascular: Normal rate, regular rhythm and normal heart sounds.   Pulmonary/Chest: Effort normal and breath sounds normal.  Abdominal: Soft. Bowel sounds are normal.  Genitourinary:     Musculoskeletal: Normal range of motion.  Neurological: She is alert and oriented to person, place, and time.  Skin: Skin is warm and dry.  Psychiatric: She has a normal mood and affect.  Nursing note and vitals reviewed.    ED Treatments / Results  Labs (all labs ordered are  listed, but only abnormal results are displayed) Labs Reviewed - No data to display  EKG  EKG Interpretation None       Radiology No results found.  Procedures Procedures (including critical care time)  INCISION AND DRAINAGE Performed by: Garlon Hatchet Consent: Verbal consent obtained. Risks and benefits: risks, benefits and alternatives were discussed Type: abscess  Body area: left labia majora  Anesthesia: local infiltration  Incision was made with a scalpel.  Local anesthetic: lidocaine 1% without epinephrine  Anesthetic total: 3 ml  Complexity: complex Blunt dissection to break up loculations  Drainage: purulent  Drainage amount: moderate  Packing material: none  Patient tolerance: Patient tolerated the procedure well with no immediate complications.     Medications Ordered in ED Medications - No data to display   Initial Impression / Assessment and Plan / ED Course  I have reviewed the triage vital signs and the nursing notes.  Pertinent labs & imaging results that were available during my care of the patient were reviewed by me and considered in my medical decision making (see chart for details).  26 year old female here with abscess to left labia majora for the past 4 days. Reports history of same. She is afebrile and nontoxic. Abscess is fluctuant appears amenable to I&D. This was performed here, patient tolerated well. Moderate amount of purulent material expressed.  Will discharge home on antibiotics given location. Encouraged warm compresses. Can follow-up with women's clinic if any ongoing issues.  Discussed plan with patient, she acknowledged understanding and agreed with plan of care.  Return precautions given for new or worsening symptoms.  Final Clinical Impressions(s) / ED Diagnoses   Final diagnoses:  Abscess    New Prescriptions New Prescriptions   CLINDAMYCIN (CLEOCIN) 150 MG CAPSULE    Take 2 capsules (300 mg total) by mouth 3  (three) times daily. May dispense as 150mg  capsules     Garlon Hatchet, PA-C 10/11/16 1030    Melene Plan, DO 10/11/16 1038

## 2016-10-11 NOTE — Discharge Instructions (Signed)
Take the prescribed medication as directed.  Recommend warm compresses to affected area at home. Follow-up with women's clinic if does not start getting better. Return to the ED for new or worsening symptoms.

## 2016-10-11 NOTE — ED Triage Notes (Signed)
Pt c/o a recurrent boil on her buttocks, that is swollen and painful to touch. No drainage noticed.

## 2016-10-11 NOTE — ED Notes (Signed)
Abscess to left inner thigh  

## 2016-10-11 NOTE — ED Notes (Signed)
Setup for I &D complete.

## 2016-12-22 ENCOUNTER — Encounter (HOSPITAL_BASED_OUTPATIENT_CLINIC_OR_DEPARTMENT_OTHER): Payer: Self-pay | Admitting: *Deleted

## 2016-12-22 ENCOUNTER — Emergency Department (HOSPITAL_BASED_OUTPATIENT_CLINIC_OR_DEPARTMENT_OTHER)
Admission: EM | Admit: 2016-12-22 | Discharge: 2016-12-22 | Disposition: A | Discharge status: Home / Self Care | Payer: Self-pay | Attending: Emergency Medicine | Admitting: Emergency Medicine

## 2016-12-22 DIAGNOSIS — B9789 Other viral agents as the cause of diseases classified elsewhere: Secondary | ICD-10-CM | POA: Insufficient documentation

## 2016-12-22 DIAGNOSIS — J069 Acute upper respiratory infection, unspecified: Secondary | ICD-10-CM | POA: Insufficient documentation

## 2016-12-22 DIAGNOSIS — F17210 Nicotine dependence, cigarettes, uncomplicated: Secondary | ICD-10-CM | POA: Insufficient documentation

## 2016-12-22 DIAGNOSIS — Z79899 Other long term (current) drug therapy: Secondary | ICD-10-CM | POA: Insufficient documentation

## 2016-12-22 MED ORDER — BENZONATATE 200 MG PO CAPS
200.0000 mg | ORAL_CAPSULE | Freq: Three times a day (TID) | ORAL | 0 refills | Status: AC | PRN
Start: 2016-12-22 — End: 2016-12-29

## 2016-12-22 NOTE — ED Notes (Signed)
ED Provider at bedside. 

## 2016-12-22 NOTE — ED Triage Notes (Signed)
Pt says that she has had a headache, cough, pain in chest with cough, and sore throat since yesterday. Pt denies fevers.

## 2016-12-22 NOTE — ED Notes (Signed)
Went to room to d/c pt and she was gone. Registration reports pt told them she should not have been charged because she "didn't see the Dr." I was in the room with the pt when the EDP was examining her. Attempted to reach pt at " given, but there was no answer. Message left to call this RN for instructions and Rx. EDP aware.

## 2016-12-22 NOTE — ED Notes (Signed)
Pt describes productive cough since yesterday. Some blood-tinged thick yellow sputum at times. Also c/o congestion and ear pain.

## 2016-12-22 NOTE — Discharge Instructions (Signed)
You may take over-the-counter medicine for symptomatic relief, such as Tylenol, Motrin, TheraFlu, Alka seltzer , black elderberry, etc. Please limit acetaminophen (Tylenol) to 4000 mg and Ibuprofen (Motrin, Advil, etc.) to 2400 mg for a 24hr period. Please note that other over-the-counter medicine may contain acetaminophen or ibuprofen as a component of their ingredients.   

## 2016-12-22 NOTE — ED Provider Notes (Signed)
[facility] EMERGENCY DEPARTMENT Provider Note  CSN: [id number] Arrival date & time: 12/22/16 2137  Chief Complaint(s) Cough  HPI Nicole Rose is a 26 y.o. female   The history is provided by the patient.  Cough  This is a new problem. The current episode started 2 days ago. The problem occurs every few minutes. The problem has not changed since onset.The cough is non-productive. There has been no fever. Associated symptoms include chest pain (only with coughing), headaches and sore throat. Pertinent negatives include no chills, no ear congestion, no rhinorrhea, no myalgias and no shortness of breath. She has tried nothing for the symptoms. She is a smoker. Her past medical history is significant for bronchitis. Her past medical history does not include asthma.    Past Medical History Past Medical History:  Diagnosis Date  . Headache(784.0)    migraine   Patient Active Problem List   Diagnosis Date Noted  . Failed vision screen 10/30/2012  . Failed hearing screening 10/30/2012  . General counseling and advice for contraceptive management 10/30/2012  . Sleep disturbance 10/30/2012  . Dizziness 10/30/2012  . Adjustment disorder with mixed anxiety and depressed mood 10/30/2012  . Smoker 10/23/2012  . 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 Epidermal inclusion cyst 01/10/2012   Home Medication(s) Prior to Admission medications   Medication Sig Start Date End Date Taking? Authorizing Provider  clindamycin (CLEOCIN) 150 MG capsule Take 2 capsules (300 mg total) by mouth 3 (three) times daily. May dispense as 150mg  capsules 10/11/16   Garlon Hatchet, PA-C  naproxen (NAPROSYN) 500 MG tablet Take 1 tablet (500 mg total) by mouth 2 (two) times daily. 05/14/15   Joy, Shawn C, PA-C  ondansetron (ZOFRAN) 4 MG tablet Take 1 tablet (4 mg total) by mouth every 6 (six) hours as needed for nausea or vomiting. 01/16/15   Ward, Chase Picket, PA-C  oxyCODONE-acetaminophen  (PERCOCET/ROXICET) 5-325 MG tablet Take 1 tablet by mouth every 4 (four) hours as needed for severe pain. 05/14/15   Anselm Pancoast, PA-C                                                                                                                                    Past Surgical History Past Surgical History:  Procedure Laterality Date  . BREAST SURGERY Right 2/14   abscess excise   Family History Family History  Problem Relation Age of Onset  . Cancer Maternal Aunt        breast  . Cancer Paternal Aunt        lung  . Cancer Maternal Aunt        breast  . Cancer Maternal Aunt        ovarian  . Cancer Maternal Aunt        ovarian    Social History Social History   Tobacco Use  . Smoking status: Current Some Day Smoker  Packs/day: 0.50    Years: 2.00    Pack years: 1.00    Types: Cigarettes  . Smokeless tobacco: Never Used  Substance Use Topics  . Alcohol use: Yes  . Drug use: No   Allergies Latex  Review of Systems Review of Systems  Constitutional: Negative for chills.  HENT: Positive for congestion, postnasal drip and sore throat. Negative for rhinorrhea.   Respiratory: Positive for cough. Negative for shortness of breath.   Cardiovascular: Positive for chest pain (only with coughing).  Musculoskeletal: Negative for myalgias.  Neurological: Positive for headaches.   All other systems are reviewed and are negative for acute change except as noted in the HPI  Physical Exam Vital Signs  I have reviewed the triage vital signs BP 131/70 (BP Location: Left Arm)   Pulse 82   Temp 98.1 F (36.7 C)   Resp 18   Ht 5\' 7"  (1.702 m)   Wt 104.3 kg (230 lb)   LMP 12/19/2016   SpO2 100%   BMI 36.02 kg/m   Physical Exam  Constitutional: She is oriented to person, place, and time. She appears well-developed and well-nourished. No distress.  HENT:  Head: Normocephalic and atraumatic.  Right Ear: Tympanic membrane normal. No middle ear effusion.  Left Ear: A  middle ear effusion is present.  Nose: Nose normal.  Mouth/Throat: No oropharyngeal exudate or posterior oropharyngeal erythema.  Post nasal drip   Eyes: Conjunctivae and EOM are normal. Pupils are equal, round, and reactive to light. Right eye exhibits no discharge. Left eye exhibits no discharge. No scleral icterus.  Neck: Normal range of motion. Neck supple.  Cardiovascular: Normal rate and regular rhythm. Exam reveals no gallop and no friction rub.  No murmur heard. Pulmonary/Chest: Effort normal and breath sounds normal. No stridor. No respiratory distress. She has no rales.  Abdominal: Soft. She exhibits no distension. There is no tenderness.  Musculoskeletal: She exhibits no edema or tenderness.  Neurological: She is alert and oriented to person, place, and time.  Skin: Skin is warm and dry. No rash noted. She is not diaphoretic. No erythema.  Psychiatric: She has a normal mood and affect.  Vitals reviewed.   ED Results and Treatments Labs (all labs ordered are listed, but only abnormal results are displayed) Labs Reviewed - No data to display                                                                                                                       EKG  EKG Interpretation  Date/Time:    Ventricular Rate:    PR Interval:    QRS Duration:   QT Interval:    QTC Calculation:   R Axis:     Text Interpretation:        Radiology No results found. Pertinent labs & imaging results that were available during my care of the patient were reviewed by me and considered in my medical decision making (see chart for details).  Medications Ordered  in ED Medications - No data to display                                                                                                                                  Procedures Procedures  (including critical care time)  Medical Decision Making / ED Course I have reviewed the nursing notes for this encounter and the  patient's prior records (if available in EHR or on provided paperwork).    26 y.o. female presents with cough, nasal congestion, sore throat and mild headache with post-tussive chest pain for 2 days. adequate oral hydration. Rest of history as above.  Patient appears well. No signs of toxicity, patient is interactive and playful. No hypoxia, tachypnea or other signs of respiratory distress. No sign of clinical dehydration. Lung exam clear. Rest of exam as above.  Most consistent with viral upper respiratory infection.   No evidence suggestive of pharyngitis, AOM, PNA, or meningitis.   Chest x-ray not indicated at this time.  Discussed symptomatic treatment with the patient and they will follow closely with their PCP.      Final Clinical Impression(s) / ED Diagnoses Final diagnoses:  None   Disposition: Discharge  Condition: Good  I have discussed the results, Dx and Tx plan with the patient who expressed understanding and agree(s) with the plan. Discharge instructions discussed at great length. The patient was given strict return precautions who verbalized understanding of the instructions. No further questions at time of discharge.     Follow Up: Primary care provider   As needed      This chart was dictated using voice recognition software.  Despite best efforts to proofread,  errors can occur which can change the documentation meaning.   Nira Connardama, Pedro Eduardo, MD 12/22/16 2258

## 2016-12-23 ENCOUNTER — Other Ambulatory Visit: Payer: Self-pay

## 2016-12-23 ENCOUNTER — Emergency Department (HOSPITAL_BASED_OUTPATIENT_CLINIC_OR_DEPARTMENT_OTHER)
Admission: EM | Admit: 2016-12-23 | Discharge: 2016-12-23 | Disposition: A | Discharge status: Home / Self Care | Payer: Self-pay | Attending: Emergency Medicine | Admitting: Emergency Medicine

## 2016-12-23 ENCOUNTER — Encounter (HOSPITAL_BASED_OUTPATIENT_CLINIC_OR_DEPARTMENT_OTHER): Payer: Self-pay | Admitting: *Deleted

## 2016-12-23 DIAGNOSIS — Z91040 Latex allergy status: Secondary | ICD-10-CM | POA: Insufficient documentation

## 2016-12-23 DIAGNOSIS — J029 Acute pharyngitis, unspecified: Secondary | ICD-10-CM | POA: Insufficient documentation

## 2016-12-23 DIAGNOSIS — F17210 Nicotine dependence, cigarettes, uncomplicated: Secondary | ICD-10-CM | POA: Insufficient documentation

## 2016-12-23 DIAGNOSIS — R6889 Other general symptoms and signs: Secondary | ICD-10-CM | POA: Insufficient documentation

## 2016-12-23 DIAGNOSIS — R05 Cough: Secondary | ICD-10-CM | POA: Insufficient documentation

## 2016-12-23 DIAGNOSIS — R51 Headache: Secondary | ICD-10-CM | POA: Insufficient documentation

## 2016-12-23 LAB — RAPID STREP SCREEN (MED CTR MEBANE ONLY): Streptococcus, Group A Screen (Direct): NEGATIVE

## 2016-12-23 MED ORDER — NAPROXEN 500 MG PO TABS: 500.0000 mg | ORAL_TABLET | Freq: Two times a day (BID) | ORAL | 0 refills | Status: AC

## 2016-12-23 MED ORDER — IBUPROFEN 800 MG PO TABS
800.0000 mg | ORAL_TABLET | Freq: Once | ORAL | Status: AC
  Administered 2016-12-23: 800 mg via ORAL
  Filled 2016-12-23: qty 1

## 2016-12-23 MED ORDER — ONDANSETRON 4 MG PO TBDP: 4.0000 mg | ORAL_TABLET | Freq: Three times a day (TID) | ORAL | 0 refills | Status: DC | PRN

## 2016-12-23 MED ORDER — ONDANSETRON 4 MG PO TBDP
4.0000 mg | ORAL_TABLET | Freq: Once | ORAL | Status: AC
  Administered 2016-12-23: 4 mg via ORAL
  Filled 2016-12-23: qty 1

## 2016-12-23 MED ORDER — ACETAMINOPHEN 325 MG PO TABS
650.0000 mg | ORAL_TABLET | Freq: Once | ORAL | Status: AC
  Administered 2016-12-23: 650 mg via ORAL
  Filled 2016-12-23: qty 2

## 2016-12-23 NOTE — ED Notes (Signed)
Pt c/o flu symptoms since Saturday, has tried OTC cold and flu medication without relief.

## 2016-12-23 NOTE — ED Provider Notes (Signed)
Emergency Department Provider Note   I have reviewed the triage vital signs and the nursing notes.   HISTORY  Chief Complaint Influenza   HPI Nicole Rose is a 26 y.o. female presents to the emergency department for evaluation of flulike symptoms which began 3 days prior.  Patient describes headache, sore throat, body aches, cough.  She came to the emergency department yesterday for similar symptoms.  She been trying over-the-counter medications with no improvement in symptoms.  She did not get her flu shot this year.  No modifying factors.  No radiation of symptoms.   Past Medical History:  Diagnosis Date  . Headache(784.0)    migraine    Patient Active Problem List   Diagnosis Date Noted  . Failed vision screen 10/30/2012  . Failed hearing screening 10/30/2012  . General counseling and advice for contraceptive management 10/30/2012  . Sleep disturbance 10/30/2012  . Dizziness 10/30/2012  . Adjustment disorder with mixed anxiety and depressed mood 10/30/2012  . Smoker 10/23/2012  . 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 Epidermal inclusion cyst 01/10/2012    Past Surgical History:  Procedure Laterality Date  . BREAST SURGERY Right 2/14   abscess excise    Current Outpatient Rx  . Order #: [id number] Class: Print  . Order #: [id number] Class: Print  . Order #: [id number] Class: Print  . Order #: [id number] Class: Print  . Order #: [id number] Class: Print  . Order #: [id number] Class: Print    Allergies Latex  Family History  Problem Relation Age of Onset  . Cancer Maternal Aunt        breast  . Cancer Paternal Aunt        lung  . Cancer Maternal Aunt        breast  . Cancer Maternal Aunt        ovarian  . Cancer Maternal Aunt        ovarian    Social History Social History   Tobacco Use  . Smoking status: Current Some Day Smoker    Packs/day: 0.50    Years: 2.00    Pack years: 1.00    Types: Cigarettes  . Smokeless tobacco: Never Used    Substance Use Topics  . Alcohol use: Yes  . Drug use: No    Review of Systems  Constitutional: Positive fever, body aches, and fatigue.  Eyes: No visual changes. ENT: Positive sore throat. Cardiovascular: Denies chest pain. Respiratory: Denies shortness of breath. Positive cough.  Gastrointestinal: No abdominal pain. Positive nausea, no vomiting.  No diarrhea.  No constipation. Genitourinary: Negative for dysuria. Musculoskeletal: Negative for back pain. Skin: Negative for rash. Neurological: Negative for focal weakness or numbness. Positive HA.   10-point ROS otherwise negative.  ____________________________________________   PHYSICAL EXAM:  VITAL SIGNS: ED Triage Vitals  Enc Vitals Group     BP 12/23/16 2005 126/71     Pulse Rate 12/23/16 2005 92     Resp 12/23/16 2005 20     Temp 12/23/16 2005 (!) 101.2 F (38.4 C)     Temp Source 12/23/16 2005 Oral     SpO2 12/23/16 2005 100 %     Weight 12/23/16 1958 230 lb (104.3 kg)     Height 12/23/16 1958 5\' 7"  (1.702 m)     Pain Score 12/23/16 1958 9    Constitutional: Alert and oriented. Well appearing and in no acute distress. Eyes: Conjunctivae are normal.  Head: Atraumatic. Nose: Positive congestion/rhinnorhea. Mouth/Throat: Mucous membranes are moist.  Oropharynx  with mild erythema. No exudate. No PTA.  Neck: No stridor.  Cardiovascular: Normal rate, regular rhythm. Good peripheral circulation. Grossly normal heart sounds.   Respiratory: Normal respiratory effort.  No retractions. Lungs CTAB. Gastrointestinal: Soft and nontender. No distention.  Musculoskeletal: No lower extremity tenderness nor edema. No gross deformities of extremities. Neurologic:  Normal speech and language. No gross focal neurologic deficits are appreciated.  Skin:  Skin is warm, dry and intact. No rash noted.  ____________________________________________   LABS (all labs ordered are listed, but only abnormal results are displayed)  Labs  Reviewed  RAPID STREP SCREEN (NOT AT [facility])  CULTURE, GROUP A STREP [facility]   ____________________________________________  RADIOLOGY  None ____________________________________________   PROCEDURES  Procedure(s) performed:   Procedures  None ____________________________________________   INITIAL IMPRESSION / ASSESSMENT AND PLAN / ED COURSE  Pertinent labs & imaging results that were available during my care of the patient were reviewed by me and considered in my medical decision making (see chart for details).  Patient presents to the emergency department with flulike symptoms.  Rapid strep is negative.  No hypoxemia.  Symmetrical lung exam.  No indication for chest x-ray at this time.  Patient is outside the window to benefit from Tamiflu.  Discussed supportive care.  Plan to discharge home with Zofran for mild nausea and plan to take naproxen as needed for muscle aches.  At this time, I do not feel there is any life-threatening condition present. I have reviewed and discussed all results (EKG, imaging, lab, urine as appropriate), exam findings with patient. I have reviewed nursing notes and appropriate previous records.  I feel the patient is safe to be discharged home without further emergent workup. Discussed usual and customary return precautions. Patient and family (if present) verbalize understanding and are comfortable with this plan.  Patient will follow-up with their primary care provider. If they do not have a primary care provider, information for follow-up has been provided to them. All questions have been answered.  ____________________________________________  FINAL CLINICAL IMPRESSION(S) / ED DIAGNOSES  Final diagnoses:  Flu-like symptoms  Sore throat     MEDICATIONS GIVEN DURING THIS VISIT:  Medications  acetaminophen (TYLENOL) tablet 650 mg (650 mg Oral Given 12/23/16 2012)  ibuprofen (ADVIL,MOTRIN) tablet 800 mg (800 mg Oral Given 12/23/16 2120)    ondansetron (ZOFRAN-ODT) disintegrating tablet 4 mg (4 mg Oral Given 12/23/16 2109)    Note:  This document was prepared using Dragon voice recognition software and may include unintentional dictation errors.  Alona BeneJoshua Kentavius Dettore, MD Emergency Medicine    Lakoda Raske, Arlyss RepressJoshua G, MD 12/24/16 [phone]

## 2016-12-23 NOTE — ED Triage Notes (Signed)
Headache, cough, fever, body aches, sore throat and soreness in her chest. She was seen for same yesterday.

## 2016-12-23 NOTE — ED Notes (Signed)
Pt verbalizes understanding of d/c instructions and denies any further needs at this time. 

## 2016-12-23 NOTE — Discharge Instructions (Signed)

## 2016-12-26 LAB — CULTURE, GROUP A STREP (THRC)

## 2017-06-11 ENCOUNTER — Encounter (HOSPITAL_BASED_OUTPATIENT_CLINIC_OR_DEPARTMENT_OTHER): Payer: Self-pay | Admitting: Emergency Medicine

## 2017-06-11 ENCOUNTER — Other Ambulatory Visit: Payer: Self-pay

## 2017-06-11 ENCOUNTER — Emergency Department (HOSPITAL_BASED_OUTPATIENT_CLINIC_OR_DEPARTMENT_OTHER)
Admission: EM | Admit: 2017-06-11 | Discharge: 2017-06-11 | Disposition: A | Discharge status: Home / Self Care | Payer: Medicaid Other | Attending: Physician Assistant | Admitting: Physician Assistant

## 2017-06-11 DIAGNOSIS — K047 Periapical abscess without sinus: Secondary | ICD-10-CM | POA: Insufficient documentation

## 2017-06-11 DIAGNOSIS — Z91040 Latex allergy status: Secondary | ICD-10-CM | POA: Insufficient documentation

## 2017-06-11 DIAGNOSIS — F17210 Nicotine dependence, cigarettes, uncomplicated: Secondary | ICD-10-CM | POA: Insufficient documentation

## 2017-06-11 HISTORY — DX: Obesity, unspecified: E66.9

## 2017-06-11 MED ORDER — ONDANSETRON 4 MG PO TBDP
ORAL_TABLET | ORAL | Status: AC
  Filled 2017-06-11: qty 1

## 2017-06-11 MED ORDER — CLINDAMYCIN HCL 150 MG PO CAPS: 300.0000 mg | ORAL_CAPSULE | Freq: Four times a day (QID) | ORAL | 0 refills | Status: AC

## 2017-06-11 MED ORDER — ONDANSETRON 4 MG PO TBDP
4.0000 mg | ORAL_TABLET | Freq: Once | ORAL | Status: AC
  Administered 2017-06-11: 4 mg via ORAL

## 2017-06-11 MED ORDER — OXYCODONE-ACETAMINOPHEN 5-325 MG PO TABS
ORAL_TABLET | ORAL | Status: AC
  Filled 2017-06-11: qty 1

## 2017-06-11 MED ORDER — CLINDAMYCIN HCL 150 MG PO CAPS
300.0000 mg | ORAL_CAPSULE | Freq: Once | ORAL | Status: AC
  Administered 2017-06-11: 300 mg via ORAL
  Filled 2017-06-11: qty 2

## 2017-06-11 MED ORDER — OXYCODONE-ACETAMINOPHEN 5-325 MG PO TABS
1.0000 | ORAL_TABLET | Freq: Once | ORAL | Status: AC
  Administered 2017-06-11: 1 via ORAL

## 2017-06-11 MED FILL — CLINDAMYCIN HCL 150 MG CAPS: 150 | 5 days supply | Qty: 40 | Fill #0

## 2017-06-11 NOTE — ED Notes (Signed)
ED Provider at bedside. 

## 2017-06-11 NOTE — Discharge Instructions (Signed)
You need to call a dentist immediately.  You have a draining dental abscess.  We have given you the antibiotics.  Please continue to monitor symptoms, return with any concerns including fever, worsening swelling, or difficulty swallowing.

## 2017-06-11 NOTE — ED Triage Notes (Signed)
Pain in left upper gum x5 days.  Started swelling 3 days ago.  Pain is now back to left ear.

## 2017-06-11 NOTE — ED Provider Notes (Signed)
[facility] EMERGENCY DEPARTMENT Provider Note   CSN: [id number] Arrival date & time: 06/11/17  5621     History   Chief Complaint Chief Complaint  Patient presents with  . Facial Swelling    Nicole Rose is a 27 y.o. female.  Nicole   A 27 year old female presenting with left facial swelling for the last 2-3 days.  Patient has poor dentition.  No fevers.  Has noticed fullness and pain in the left ear subsequently. Past Medical History:  Diagnosis Date  . Headache(784.0)    migraine  . Obesity     Patient Active Problem List   Diagnosis Date Noted  . Failed vision screen 10/30/2012  . Failed hearing screening 10/30/2012  . General counseling and advice for contraceptive management 10/30/2012  . Sleep disturbance 10/30/2012  . Dizziness 10/30/2012  . Adjustment disorder with mixed anxiety and depressed mood 10/30/2012  . Smoker 10/23/2012  . 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 Epidermal inclusion cyst 01/10/2012    Past Surgical History:  Procedure Laterality Date  . BREAST BIOPSY Right 09/28/2012   Procedure: EXCISION OF CHRONIC RIGHT BREAST ABSCESS;  Surgeon: Almond Lint, MD;  Location: [facility] OR;  Service: General;  Laterality: Right;  . BREAST SURGERY Right 2/14   abscess excise  . SKIN CANCER EXCISION       OB History   None      Home Medications    Prior to Admission medications   Medication Sig Start Date End Date Taking? Authorizing Provider  albuterol (ACCUNEB) 1.25 MG/3ML nebulizer solution Take 1 ampule by nebulization every 6 (six) hours as needed for wheezing.   Yes [provider]  clindamycin (CLEOCIN) 150 MG capsule Take 2 capsules (300 mg total) by mouth 3 (three) times daily. May dispense as 150mg  capsules 10/11/16   Garlon Hatchet, PA-C  ondansetron (ZOFRAN ODT) 4 MG disintegrating tablet Take 1 tablet (4 mg total) every 8 (eight) hours as needed by mouth for nausea or vomiting. 12/23/16   Long, Arlyss Repress, MD    ondansetron (ZOFRAN) 4 MG tablet Take 1 tablet (4 mg total) by mouth every 6 (six) hours as needed for nausea or vomiting. 01/16/15   Ward, Chase Picket, PA-C  oxyCODONE-acetaminophen (PERCOCET/ROXICET) 5-325 MG tablet Take 1 tablet by mouth every 4 (four) hours as needed for severe pain. 05/14/15   Joy, Shawn C, PA-C  promethazine (PHENERGAN) 25 MG tablet Take 1 tablet (25 mg total) by mouth every 6 (six) hours as needed for nausea or vomiting. Patient not taking: Reported on 07/04/2014 10/15/13 01/16/15  Arby Barrette, MD    Family History Family History  Problem Relation Age of Onset  . Cancer Maternal Aunt        breast  . Cancer Paternal Aunt        lung  . Cancer Maternal Aunt        breast  . Cancer Maternal Aunt        ovarian  . Cancer Maternal Aunt        ovarian    Social History Social History   Tobacco Use  . Smoking status: Current Some Day Smoker    Packs/day: 0.50    Years: 2.00    Pack years: 1.00    Types: Cigarettes  . Smokeless tobacco: Never Used  Substance Use Topics  . Alcohol use: Yes    Comment: occ  . Drug use: No     Allergies   Latex  Review of Systems Review of Systems  Constitutional: Negative for activity change, fatigue and fever.  HENT: Positive for ear pain and facial swelling.   Respiratory: Negative for shortness of breath.   Cardiovascular: Negative for chest pain.  Gastrointestinal: Negative for abdominal pain.  All other systems reviewed and are negative.    Physical Exam Updated Vital Signs BP 122/68 (BP Location: Right Arm)   Pulse 68   Temp 98.3 F (36.8 C) (Oral)   Resp 16   Ht 5\' 7"  (1.702 m)   Wt 108.9 kg (240 lb)   LMP 05/21/2017   SpO2 100%   BMI 37.59 kg/m   Physical Exam  Constitutional: She is oriented to person, place, and time. She appears well-developed and well-nourished.  HENT:  Head: Normocephalic and atraumatic.  Right Ear: External ear normal.  Left facial swelling in cheek/upper  mandible.  Poor dentition in left upper.  Multiple broken teeth with caries.  Patient has actively draining abscess.  Approximately 5 cc of pus drained from left upper mandible.  Both TMs, bulging.  Eyes: Right eye exhibits no discharge. Left eye exhibits no discharge.  Cardiovascular: Normal rate.  Pulmonary/Chest: Effort normal.  Neurological: She is oriented to person, place, and time.  Skin: Skin is warm and dry. She is not diaphoretic.  Psychiatric: She has a normal mood and affect.  Nursing note and vitals reviewed.    ED Treatments / Results  Labs (all labs ordered are listed, but only abnormal results are displayed) Labs Reviewed - No data to display  EKG None  Radiology No results found.  Procedures Procedures (including critical care time)  Medications Ordered in ED Medications  ondansetron (ZOFRAN-ODT) 4 MG disintegrating tablet (has no administration in time range)  oxyCODONE-acetaminophen (PERCOCET/ROXICET) 5-325 MG per tablet (has no administration in time range)  oxyCODONE-acetaminophen (PERCOCET/ROXICET) 5-325 MG per tablet 1 tablet (1 tablet Oral Given 06/11/17 0857)  ondansetron (ZOFRAN-ODT) disintegrating tablet 4 mg (4 mg Oral Given 06/11/17 0857)     Initial Impression / Assessment and Plan / ED Course  I have reviewed the triage vital signs and the nursing notes.  Pertinent labs & imaging results that were available during my care of the patient were reviewed by me and considered in my medical decision making (see chart for details).     A 27 year old female presenting with left facial swelling for the last 2-3 days.  Patient has poor dentition.  No fevers.  Has noticed fullness and pain in the left ear subsequently.  9:08 AM As I retracted patient's left upper cheek to get a better view at her teeth, purulent fluid started to drain from left upper mandible, significant drainage approximately 5 cc noted.  We will give antibiotics continue treat this  dental abscess.  Patient has a dentist in the area and will follow up directly after discharge.  Final Clinical Impressions(s) / ED Diagnoses   Final diagnoses:  None    ED Discharge Orders    None       Abelino DerrickMackuen, Zoeya Gramajo Lyn, MD 06/11/17 [id number]

## 2018-01-18 DIAGNOSIS — S62309A Unspecified fracture of unspecified metacarpal bone, initial encounter for closed fracture: Secondary | ICD-10-CM

## 2018-01-18 HISTORY — DX: Unspecified fracture of unspecified metacarpal bone, initial encounter for closed fracture: S62.309A

## 2018-01-25 ENCOUNTER — Other Ambulatory Visit: Payer: Self-pay

## 2018-01-25 ENCOUNTER — Encounter (HOSPITAL_COMMUNITY): Payer: Self-pay | Admitting: Emergency Medicine

## 2018-01-25 ENCOUNTER — Emergency Department (HOSPITAL_COMMUNITY): Payer: Medicaid Other

## 2018-01-25 ENCOUNTER — Emergency Department (HOSPITAL_COMMUNITY)
Admission: EM | Admit: 2018-01-25 | Discharge: 2018-01-25 | Disposition: A | Discharge status: Home / Self Care | Payer: Medicaid Other | Attending: Emergency Medicine | Admitting: Emergency Medicine

## 2018-01-25 DIAGNOSIS — F17210 Nicotine dependence, cigarettes, uncomplicated: Secondary | ICD-10-CM | POA: Insufficient documentation

## 2018-01-25 DIAGNOSIS — W2209XA Striking against other stationary object, initial encounter: Secondary | ICD-10-CM | POA: Insufficient documentation

## 2018-01-25 DIAGNOSIS — Y999 Unspecified external cause status: Secondary | ICD-10-CM | POA: Insufficient documentation

## 2018-01-25 DIAGNOSIS — Y92032 Bedroom in apartment as the place of occurrence of the external cause: Secondary | ICD-10-CM | POA: Insufficient documentation

## 2018-01-25 DIAGNOSIS — S62325A Displaced fracture of shaft of fourth metacarpal bone, left hand, initial encounter for closed fracture: Secondary | ICD-10-CM | POA: Insufficient documentation

## 2018-01-25 DIAGNOSIS — Y939 Activity, unspecified: Secondary | ICD-10-CM | POA: Insufficient documentation

## 2018-01-25 DIAGNOSIS — S6292XA Unspecified fracture of left wrist and hand, initial encounter for closed fracture: Secondary | ICD-10-CM

## 2018-01-25 MED ORDER — HYDROCODONE-ACETAMINOPHEN 5-325 MG PO TABS
2.0000 | ORAL_TABLET | Freq: Once | ORAL | Status: AC
  Administered 2018-01-25: 2 via ORAL
  Filled 2018-01-25: qty 2

## 2018-01-25 MED ORDER — HYDROCODONE-ACETAMINOPHEN 5-325 MG PO TABS: 1.0000 | ORAL_TABLET | Freq: Four times a day (QID) | ORAL | 0 refills | Status: DC | PRN

## 2018-01-25 NOTE — ED Notes (Signed)
Ortho called for Ulnar Gutter

## 2018-01-25 NOTE — ED Triage Notes (Signed)
Pt c/o left wrist pain that radiates up towards the elbow. States she hit her arm on the headboard of her bed. Sensation intact.

## 2018-01-25 NOTE — ED Provider Notes (Signed)
[facility] EMERGENCY DEPARTMENT Provider Note   CSN: [id number] Arrival date & time: 01/25/18  0200     History   Chief Complaint Chief Complaint  Patient presents with  . Arm Pain    HPI Nicole Rose is a 27 y.o. female.  Patient presents to the emergency department with a chief complaint of left hand injury.  She states that she stumbled while getting out of bed and hit her left hand on the headboard.  She complains of moderate to severe pain.  The pain is worsened with palpation.  She has not taken anything for the symptoms.  Reports having some tingling and swelling.  The history is provided by the patient. No language interpreter was used.    Past Medical History:  Diagnosis Date  . Headache(784.0)    migraine  . Obesity     Patient Active Problem List   Diagnosis Date Noted  . Failed vision screen 10/30/2012  . Failed hearing screening 10/30/2012  . General counseling and advice for contraceptive management 10/30/2012  . Sleep disturbance 10/30/2012  . Dizziness 10/30/2012  . Adjustment disorder with mixed anxiety and depressed mood 10/30/2012  . Smoker 10/23/2012  . 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 Epidermal inclusion cyst 01/10/2012    Past Surgical History:  Procedure Laterality Date  . BREAST BIOPSY Right 09/28/2012   Procedure: EXCISION OF CHRONIC RIGHT BREAST ABSCESS;  Surgeon: Almond Lint, MD;  Location: [facility] OR;  Service: General;  Laterality: Right;  . BREAST SURGERY Right 2/14   abscess excise  . SKIN CANCER EXCISION       OB History   None      Home Medications    Prior to Admission medications   Medication Sig Start Date End Date Taking? Authorizing Provider  albuterol (ACCUNEB) 1.25 MG/3ML nebulizer solution Take 1 ampule by nebulization every 6 (six) hours as needed for wheezing.    [provider]  clindamycin (CLEOCIN) 150 MG capsule Take 2 capsules (300 mg total) by mouth 3 (three) times  daily. May dispense as 150mg  capsules 10/11/16   Garlon Hatchet, PA-C  ondansetron (ZOFRAN ODT) 4 MG disintegrating tablet Take 1 tablet (4 mg total) every 8 (eight) hours as needed by mouth for nausea or vomiting. 12/23/16   Long, Arlyss Repress, MD  ondansetron (ZOFRAN) 4 MG tablet Take 1 tablet (4 mg total) by mouth every 6 (six) hours as needed for nausea or vomiting. 01/16/15   Ward, Chase Picket, PA-C  oxyCODONE-acetaminophen (PERCOCET/ROXICET) 5-325 MG tablet Take 1 tablet by mouth every 4 (four) hours as needed for severe pain. 05/14/15   Joy, Hillard Danker, PA-C    Family History Family History  Problem Relation Age of Onset  . Cancer Maternal Aunt        breast  . Cancer Paternal Aunt        lung  . Cancer Maternal Aunt        breast  . Cancer Maternal Aunt        ovarian  . Cancer Maternal Aunt        ovarian    Social History Social History   Tobacco Use  . Smoking status: Current Some Day Smoker    Packs/day: 0.50    Years: 2.00    Pack years: 1.00    Types: Cigarettes  . Smokeless tobacco: Never Used  Substance Use Topics  . Alcohol use: Yes    Comment: occ  . Drug use:  No     Allergies   Latex   Review of Systems Review of Systems  All other systems reviewed and are negative.    Physical Exam Updated Vital Signs BP 119/86 (BP Location: Right Arm)   Pulse 65   Temp 98.1 F (36.7 C) (Oral)   Resp 18   SpO2 100%   Physical Exam  Constitutional: She is oriented to person, place, and time. No distress.  HENT:  Head: Normocephalic and atraumatic.  Eyes: Pupils are equal, round, and reactive to light. Conjunctivae and EOM are normal.  Neck: No tracheal deviation present.  Cardiovascular: Normal rate.  Intact distal pulses with brisk capillary refill  Pulmonary/Chest: Effort normal. No respiratory distress.  Abdominal: Soft.  Musculoskeletal:  Left hand tender to palpation over the fourth metacarpal with mild swelling, range of motion and strength of left  wrist is 5/5, grip strength not tested due to pain  Neurological: She is alert and oriented to person, place, and time.  Distal sensation intact  Skin: Skin is warm and dry. She is not diaphoretic.  Psychiatric: Judgment normal.  Nursing note and vitals reviewed.    ED Treatments / Results  Labs (all labs ordered are listed, but only abnormal results are displayed) Labs Reviewed - No data to display  EKG None  Radiology Dg Wrist Complete Left  Result Date: 01/25/2018 CLINICAL DATA:  Initial evaluation for acute trauma. EXAM: LEFT WRIST - COMPLETE 3+ VIEW COMPARISON:  None. FINDINGS: Acute mildly displaced oblique fracture of the left fourth metacarpal. No other acute fracture or dislocation about the visualized hand or wrist. Osseous mineralization normal. No appreciable soft tissue injury. IMPRESSION: Acute mildly displaced oblique fracture of the left fourth metacarpal. Electronically Signed   By: Rise MuBenjamin  McClintock M.D.   On: 01/25/2018 02:55    Procedures Procedures (including critical care time)  Medications Ordered in ED Medications  HYDROcodone-acetaminophen (NORCO/VICODIN) 5-325 MG per tablet 2 tablet (has no administration in time range)     Initial Impression / Assessment and Plan / ED Course  I have reviewed the triage vital signs and the nursing notes.  Pertinent labs & imaging results that were available during my care of the patient were reviewed by me and considered in my medical decision making (see chart for details).     Patient with left fourth metacarpal fracture.  Will place patient in ulnar gutter splint and recommend hand follow-up.  Patient understands and agrees with plan.  Final Clinical Impressions(s) / ED Diagnoses   Final diagnoses:  Closed fracture of left hand, initial encounter    ED Discharge Orders         Ordered    HYDROcodone-acetaminophen (NORCO/VICODIN) 5-325 MG tablet  Every 6 hours PRN     01/25/18 0352             Roxy HorsemanBrowning, English Tomer, PA-C 01/25/18 0353    Horton, Mayer Maskerourtney F, MD 01/25/18 [phone]

## 2018-02-02 ENCOUNTER — Encounter (HOSPITAL_BASED_OUTPATIENT_CLINIC_OR_DEPARTMENT_OTHER): Payer: Self-pay | Admitting: *Deleted

## 2018-02-02 ENCOUNTER — Other Ambulatory Visit: Payer: Self-pay

## 2018-02-06 ENCOUNTER — Ambulatory Visit (HOSPITAL_BASED_OUTPATIENT_CLINIC_OR_DEPARTMENT_OTHER): Payer: Self-pay | Admitting: Anesthesiology

## 2018-02-06 ENCOUNTER — Encounter (HOSPITAL_BASED_OUTPATIENT_CLINIC_OR_DEPARTMENT_OTHER): Payer: Self-pay | Admitting: *Deleted

## 2018-02-06 ENCOUNTER — Ambulatory Visit (HOSPITAL_BASED_OUTPATIENT_CLINIC_OR_DEPARTMENT_OTHER)
Admission: RE | Admit: 2018-02-06 | Discharge: 2018-02-06 | Disposition: A | Discharge status: Home / Self Care | Payer: Self-pay | Source: Ambulatory Visit | Attending: Orthopedic Surgery | Admitting: Orthopedic Surgery

## 2018-02-06 ENCOUNTER — Encounter (HOSPITAL_BASED_OUTPATIENT_CLINIC_OR_DEPARTMENT_OTHER)
Admission: RE | Disposition: A | Discharge status: Home / Self Care | Payer: Self-pay | Source: Ambulatory Visit | Attending: Orthopedic Surgery

## 2018-02-06 ENCOUNTER — Other Ambulatory Visit: Payer: Self-pay

## 2018-02-06 DIAGNOSIS — D649 Anemia, unspecified: Secondary | ICD-10-CM | POA: Insufficient documentation

## 2018-02-06 DIAGNOSIS — Y939 Activity, unspecified: Secondary | ICD-10-CM | POA: Insufficient documentation

## 2018-02-06 DIAGNOSIS — X58XXXA Exposure to other specified factors, initial encounter: Secondary | ICD-10-CM | POA: Insufficient documentation

## 2018-02-06 DIAGNOSIS — F17210 Nicotine dependence, cigarettes, uncomplicated: Secondary | ICD-10-CM | POA: Insufficient documentation

## 2018-02-06 DIAGNOSIS — E669 Obesity, unspecified: Secondary | ICD-10-CM | POA: Insufficient documentation

## 2018-02-06 DIAGNOSIS — S62201A Unspecified fracture of first metacarpal bone, right hand, initial encounter for closed fracture: Secondary | ICD-10-CM

## 2018-02-06 DIAGNOSIS — G43909 Migraine, unspecified, not intractable, without status migrainosus: Secondary | ICD-10-CM | POA: Insufficient documentation

## 2018-02-06 DIAGNOSIS — S62305A Unspecified fracture of fourth metacarpal bone, left hand, initial encounter for closed fracture: Secondary | ICD-10-CM | POA: Insufficient documentation

## 2018-02-06 DIAGNOSIS — Z6834 Body mass index (BMI) 34.0-34.9, adult: Secondary | ICD-10-CM | POA: Insufficient documentation

## 2018-02-06 HISTORY — DX: Anemia, unspecified: D64.9

## 2018-02-06 HISTORY — PX: OPEN REDUCTION INTERNAL FIXATION (ORIF) METACARPAL: SHX6234

## 2018-02-06 HISTORY — DX: Unspecified fracture of unspecified metacarpal bone, initial encounter for closed fracture: S62.309A

## 2018-02-06 HISTORY — DX: Migraine, unspecified, not intractable, without status migrainosus: G43.909

## 2018-02-06 LAB — POCT PREGNANCY, URINE: Preg Test, Ur: NEGATIVE

## 2018-02-06 SURGERY — OPEN REDUCTION INTERNAL FIXATION (ORIF) METACARPAL
Anesthesia: Regional | Site: Hand | Laterality: Left

## 2018-02-06 MED ORDER — BUPIVACAINE-EPINEPHRINE (PF) 0.5% -1:200000 IJ SOLN
INTRAMUSCULAR | Status: DC | PRN
  Administered 2018-02-06: 30 mL via PERINEURAL

## 2018-02-06 MED ORDER — GABAPENTIN 300 MG PO CAPS
300.0000 mg | ORAL_CAPSULE | Freq: Once | ORAL | Status: AC
  Administered 2018-02-06: 300 mg via ORAL

## 2018-02-06 MED ORDER — ONDANSETRON HCL 4 MG PO TABS: 4.0000 mg | ORAL_TABLET | Freq: Three times a day (TID) | ORAL | 0 refills | Status: DC | PRN

## 2018-02-06 MED ORDER — SCOPOLAMINE 1 MG/3DAYS TD PT72: 1.0000 | MEDICATED_PATCH | Freq: Once | TRANSDERMAL | Status: DC | PRN

## 2018-02-06 MED ORDER — ONDANSETRON HCL 4 MG/2ML IJ SOLN
INTRAMUSCULAR | Status: DC | PRN
  Administered 2018-02-06: 4 mg via INTRAVENOUS

## 2018-02-06 MED ORDER — FENTANYL CITRATE (PF) 100 MCG/2ML IJ SOLN
INTRAMUSCULAR | Status: AC
  Filled 2018-02-06: qty 2

## 2018-02-06 MED ORDER — PROPOFOL 10 MG/ML IV BOLUS
INTRAVENOUS | Status: DC | PRN
  Administered 2018-02-06: 20 mg via INTRAVENOUS

## 2018-02-06 MED ORDER — ONDANSETRON HCL 4 MG/2ML IJ SOLN
INTRAMUSCULAR | Status: AC
  Filled 2018-02-06: qty 2

## 2018-02-06 MED ORDER — FENTANYL CITRATE (PF) 100 MCG/2ML IJ SOLN: 25.0000 ug | INTRAMUSCULAR | Status: DC | PRN

## 2018-02-06 MED ORDER — PROPOFOL 500 MG/50ML IV EMUL
INTRAVENOUS | Status: DC | PRN
  Administered 2018-02-06: 100 ug/kg/min via INTRAVENOUS

## 2018-02-06 MED ORDER — METOCLOPRAMIDE HCL 5 MG/ML IJ SOLN: 10.0000 mg | Freq: Once | INTRAMUSCULAR | Status: DC | PRN

## 2018-02-06 MED ORDER — LIDOCAINE HCL (CARDIAC) PF 100 MG/5ML IV SOSY
PREFILLED_SYRINGE | INTRAVENOUS | Status: DC | PRN
  Administered 2018-02-06: 50 mg via INTRAVENOUS

## 2018-02-06 MED ORDER — LIDOCAINE 2% (20 MG/ML) 5 ML SYRINGE
INTRAMUSCULAR | Status: AC
  Filled 2018-02-06: qty 5

## 2018-02-06 MED ORDER — PHENYLEPHRINE 40 MCG/ML (10ML) SYRINGE FOR IV PUSH (FOR BLOOD PRESSURE SUPPORT)
PREFILLED_SYRINGE | INTRAVENOUS | Status: AC
  Filled 2018-02-06: qty 10

## 2018-02-06 MED ORDER — CEFAZOLIN SODIUM-DEXTROSE 2-4 GM/100ML-% IV SOLN
2.0000 g | INTRAVENOUS | Status: AC
  Administered 2018-02-06: 2 g via INTRAVENOUS

## 2018-02-06 MED ORDER — FENTANYL CITRATE (PF) 100 MCG/2ML IJ SOLN
50.0000 ug | INTRAMUSCULAR | Status: AC | PRN
  Administered 2018-02-06: 50 ug via INTRAVENOUS
  Administered 2018-02-06: 100 ug via INTRAVENOUS
  Administered 2018-02-06: 50 ug via INTRAVENOUS

## 2018-02-06 MED ORDER — HYDROCODONE-ACETAMINOPHEN 5-325 MG PO TABS: 1.0000 | ORAL_TABLET | Freq: Four times a day (QID) | ORAL | 0 refills | Status: DC | PRN

## 2018-02-06 MED ORDER — EPHEDRINE 5 MG/ML INJ
INTRAVENOUS | Status: AC
  Filled 2018-02-06: qty 10

## 2018-02-06 MED ORDER — ACETAMINOPHEN 500 MG PO TABS
1000.0000 mg | ORAL_TABLET | Freq: Once | ORAL | Status: AC
  Administered 2018-02-06: 1000 mg via ORAL

## 2018-02-06 MED ORDER — HYDROCODONE-ACETAMINOPHEN 7.5-325 MG PO TABS: 1.0000 | ORAL_TABLET | Freq: Once | ORAL | Status: DC | PRN

## 2018-02-06 MED ORDER — MEPERIDINE HCL 25 MG/ML IJ SOLN: 6.2500 mg | INTRAMUSCULAR | Status: DC | PRN

## 2018-02-06 MED ORDER — SUCCINYLCHOLINE CHLORIDE 200 MG/10ML IV SOSY
PREFILLED_SYRINGE | INTRAVENOUS | Status: AC
  Filled 2018-02-06: qty 10

## 2018-02-06 MED ORDER — LACTATED RINGERS IV SOLN
INTRAVENOUS | Status: DC
  Administered 2018-02-06 (×2): via INTRAVENOUS

## 2018-02-06 MED ORDER — CHLORHEXIDINE GLUCONATE 4 % EX LIQD: 60.0000 mL | Freq: Once | CUTANEOUS | Status: DC

## 2018-02-06 MED ORDER — GABAPENTIN 300 MG PO CAPS
ORAL_CAPSULE | ORAL | Status: AC
  Filled 2018-02-06: qty 1

## 2018-02-06 MED ORDER — MIDAZOLAM HCL 2 MG/2ML IJ SOLN
INTRAMUSCULAR | Status: AC
  Filled 2018-02-06: qty 2

## 2018-02-06 MED ORDER — LACTATED RINGERS IV SOLN
INTRAVENOUS | Status: DC
  Administered 2018-02-06: 11:00:00 via INTRAVENOUS

## 2018-02-06 MED ORDER — DEXAMETHASONE SODIUM PHOSPHATE 10 MG/ML IJ SOLN
INTRAMUSCULAR | Status: AC
  Filled 2018-02-06: qty 1

## 2018-02-06 MED ORDER — CEFAZOLIN SODIUM-DEXTROSE 2-4 GM/100ML-% IV SOLN
INTRAVENOUS | Status: AC
  Filled 2018-02-06: qty 100

## 2018-02-06 MED ORDER — MIDAZOLAM HCL 2 MG/2ML IJ SOLN
1.0000 mg | INTRAMUSCULAR | Status: DC | PRN
  Administered 2018-02-06: 1 mg via INTRAVENOUS
  Administered 2018-02-06: 2 mg via INTRAVENOUS
  Administered 2018-02-06: 1 mg via INTRAVENOUS

## 2018-02-06 MED ORDER — ACETAMINOPHEN 500 MG PO TABS
ORAL_TABLET | ORAL | Status: AC
  Filled 2018-02-06: qty 2

## 2018-02-06 SURGICAL SUPPLY — 66 items
BANDAGE ACE 4X5 VEL STRL LF (GAUZE/BANDAGES/DRESSINGS) ×3 IMPLANT
BIT DRILL 1.9 AO (BIT) ×1
BIT DRILL 1.9MM AO (BIT) ×1 IMPLANT
BIT DRILL SURG TWST 2.5X87 LAG (DRILL) ×1 IMPLANT
BLADE SURG 15 STRL LF DISP TIS (BLADE) ×1 IMPLANT
BLADE SURG 15 STRL SS (BLADE) ×2
BNDG ESMARK 4X9 LF (GAUZE/BANDAGES/DRESSINGS) ×3 IMPLANT
CHLORAPREP W/TINT 26ML (MISCELLANEOUS) ×3 IMPLANT
CLOSURE STERI-STRIP 1/2X4 (GAUZE/BANDAGES/DRESSINGS) ×1
CLSR STERI-STRIP ANTIMIC 1/2X4 (GAUZE/BANDAGES/DRESSINGS) ×2 IMPLANT
CORD BIPOLAR FORCEPS 12FT (ELECTRODE) ×3 IMPLANT
COVER BACK TABLE 60X90IN (DRAPES) ×3 IMPLANT
COVER WAND RF STERILE (DRAPES) IMPLANT
CUFF TOURNIQUET SINGLE 18IN (TOURNIQUET CUFF) IMPLANT
CUFF TOURNIQUET SINGLE 24IN (TOURNIQUET CUFF) IMPLANT
DECANTER SPIKE VIAL GLASS SM (MISCELLANEOUS) IMPLANT
DRAPE EXTREMITY T 121X128X90 (DRAPE) ×3 IMPLANT
DRAPE IMP U-DRAPE 54X76 (DRAPES) ×3 IMPLANT
DRAPE OEC MINIVIEW 54X84 (DRAPES) ×3 IMPLANT
DRAPE SURG 17X23 STRL (DRAPES) ×3 IMPLANT
DRILL BIT 1.9MM AO (BIT) ×2
DRILL SURG TWIST 2.5X87 LAG (DRILL) ×3
DRSG EMULSION OIL 3X3 NADH (GAUZE/BANDAGES/DRESSINGS) ×3 IMPLANT
GAUZE SPONGE 4X4 12PLY STRL (GAUZE/BANDAGES/DRESSINGS) ×3 IMPLANT
GAUZE XEROFORM 1X8 LF (GAUZE/BANDAGES/DRESSINGS) ×3 IMPLANT
GLOVE BIO SURGEON STRL SZ7.5 (GLOVE) ×6 IMPLANT
GLOVE BIOGEL PI IND STRL 8 (GLOVE) ×2 IMPLANT
GLOVE BIOGEL PI INDICATOR 8 (GLOVE) ×4
GOWN STRL REUS W/ TWL LRG LVL3 (GOWN DISPOSABLE) ×3 IMPLANT
GOWN STRL REUS W/ TWL XL LVL3 (GOWN DISPOSABLE) ×1 IMPLANT
GOWN STRL REUS W/TWL LRG LVL3 (GOWN DISPOSABLE) ×6
GOWN STRL REUS W/TWL XL LVL3 (GOWN DISPOSABLE) ×2
NEEDLE HYPO 25X1 1.5 SAFETY (NEEDLE) ×3 IMPLANT
NS IRRIG 1000ML POUR BTL (IV SOLUTION) ×3 IMPLANT
PACK BASIN DAY SURGERY FS (CUSTOM PROCEDURE TRAY) ×3 IMPLANT
PAD CAST 4YDX4 CTTN HI CHSV (CAST SUPPLIES) ×1 IMPLANT
PADDING CAST ABS 4INX4YD NS (CAST SUPPLIES)
PADDING CAST ABS COTTON 4X4 ST (CAST SUPPLIES) IMPLANT
PADDING CAST COTTON 4X4 STRL (CAST SUPPLIES) ×2
PLATE SM 4H STR HAND LOCK (Plate) ×3 IMPLANT
SCREW BONE 2.3X14MM (Screw) ×3 IMPLANT
SCREW BONE 2.3X8MM (Screw) ×9 IMPLANT
SCREW BONE 2.3X9MM (Screw) ×3 IMPLANT
SLEEVE SCD COMPRESS KNEE MED (MISCELLANEOUS) ×3 IMPLANT
SPLINT FAST PLASTER 5X30 (CAST SUPPLIES)
SPLINT PLASTER CAST FAST 5X30 (CAST SUPPLIES) IMPLANT
SPLINT PLASTER CAST XFAST 3X15 (CAST SUPPLIES) IMPLANT
SPLINT PLASTER CAST XFAST 4X15 (CAST SUPPLIES) IMPLANT
SPLINT PLASTER XTRA FAST SET 4 (CAST SUPPLIES)
SPLINT PLASTER XTRA FASTSET 3X (CAST SUPPLIES)
SUCTION FRAZIER HANDLE 10FR (MISCELLANEOUS) ×4
SUCTION TUBE FRAZIER 10FR DISP (MISCELLANEOUS) ×2 IMPLANT
SUT ETHILON 3 0 PS 1 (SUTURE) ×3 IMPLANT
SUT MON AB 2-0 CT1 36 (SUTURE) ×3 IMPLANT
SUT MON AB 4-0 PC3 18 (SUTURE) ×3 IMPLANT
SUT PROLENE 3 0 PS 2 (SUTURE) IMPLANT
SUT VIC AB 2-0 SH 27 (SUTURE)
SUT VIC AB 2-0 SH 27XBRD (SUTURE) IMPLANT
SUT VIC AB 3-0 FS2 27 (SUTURE) IMPLANT
SYR BULB 3OZ (MISCELLANEOUS) ×3 IMPLANT
SYR CONTROL 10ML LL (SYRINGE) ×3 IMPLANT
TOWEL GREEN STERILE FF (TOWEL DISPOSABLE) ×3 IMPLANT
TOWEL OR NON WOVEN STRL DISP B (DISPOSABLE) IMPLANT
TUBE CONNECTING 20'X1/4 (TUBING) ×1
TUBE CONNECTING 20X1/4 (TUBING) ×2 IMPLANT
UNDERPAD 30X30 (UNDERPADS AND DIAPERS) ×3 IMPLANT

## 2018-02-06 NOTE — Anesthesia Preprocedure Evaluation (Signed)
Anesthesia Evaluation  Patient identified by MRN, date of birth, ID band Patient awake    Reviewed: Allergy & Precautions, NPO status , Patient's Chart, lab work & pertinent test results  Airway Mallampati: II  TM Distance: >3 FB Neck ROM: Full    Dental no notable dental hx. (+) Teeth Intact   Pulmonary Current Smoker,    Pulmonary exam normal breath sounds clear to auscultation       Cardiovascular negative cardio ROS Normal cardiovascular exam Rate:Normal     Neuro/Psych  Headaches, PSYCHIATRIC DISORDERS    GI/Hepatic negative GI ROS, Neg liver ROS,   Endo/Other  Obesity  Renal/GU negative Renal ROS  negative genitourinary   Musculoskeletal Fx Left 4th metacarpal   Abdominal (+) + obese,   Peds  Hematology  (+) anemia ,   Anesthesia Other Findings   Reproductive/Obstetrics                             Anesthesia Physical Anesthesia Plan  ASA: II  Anesthesia Plan: Regional   Post-op Pain Management:    Induction:   PONV Risk Score and Plan: 1 and Ondansetron and Treatment may vary due to age or medical condition  Airway Management Planned: Natural Airway, Nasal Cannula and Simple Face Mask  Additional Equipment:   Intra-op Plan:   Post-operative Plan:   Informed Consent: I have reviewed the patients History and Physical, chart, labs and discussed the procedure including the risks, benefits and alternatives for the proposed anesthesia with the patient or authorized representative who has indicated his/her understanding and acceptance.   Dental advisory given  Plan Discussed with: CRNA and Surgeon  Anesthesia Plan Comments:         Anesthesia Quick Evaluation

## 2018-02-06 NOTE — H&P (Signed)
ORTHOPAEDIC CONSULTATION  REQUESTING PHYSICIAN: Renette Butters, MD  Chief Complaint: Lt hand injury  HPI: Nicole Rose is a 27 y.o. female who complains of L 4th MC fx  Past Medical History:  Diagnosis Date  . Anemia    no current med.  . Metacarpal bone fracture 01/2018   left 4th  . Migraines   . Obesity    Past Surgical History:  Procedure Laterality Date  . BREAST BIOPSY Right 09/28/2012   Procedure: EXCISION OF CHRONIC RIGHT BREAST ABSCESS;  Surgeon: Stark Klein, MD;  Location: [place];  Service: General;  Laterality: Right;   Social History   Socioeconomic History  . Marital status: Single    Spouse name: Not on file  . Number of children: Not on file  . Years of education: Not on file  . Highest education level: Not on file  Occupational History  . Not on file  Social Needs  . Financial resource strain: Not on file  . Food insecurity:    Worry: Not on file    Inability: Not on file  . Transportation needs:    Medical: Not on file    Non-medical: Not on file  Tobacco Use  . Smoking status: Current Every Day Smoker    Packs/day: 0.00    Years: 7.00    Pack years: 0.00    Types: Cigarettes  . Smokeless tobacco: Never Used  . Tobacco comment: 6 cig./day  Substance and Sexual Activity  . Alcohol use: Yes    Comment: occasionally  . Drug use: No  . Sexual activity: Never  Lifestyle  . Physical activity:    Days per week: Not on file    Minutes per session: Not on file  . Stress: Not on file  Relationships  . Social connections:    Talks on phone: Not on file    Gets together: Not on file    Attends religious service: Not on file    Active member of club or organization: Not on file    Attends meetings of clubs or organizations: Not on file    Relationship status: Not on file  Other Topics Concern  . Not on file  Social History Narrative  . Not on file   Family History  Problem Relation Age of Onset  . Cancer Maternal Aunt     breast  . Cancer Paternal Aunt        lung  . Cancer Maternal Aunt        breast  . Cancer Maternal Aunt        ovarian  . Cancer Maternal Aunt        ovarian   Allergies  Allergen Reactions  . Latex Hives   Prior to Admission medications   Medication Sig Start Date End Date Taking? Authorizing Provider  HYDROcodone-acetaminophen (NORCO/VICODIN) 5-325 MG tablet Take 1-2 tablets by mouth every 6 (six) hours as needed. 01/25/18  Yes Montine Circle, PA-C   No results found.  Positive ROS: All other systems have been reviewed and were otherwise negative with the exception of those mentioned in the HPI and as above.  Labs cbc No results for input(s): WBC, HGB, HCT, PLT in the last 72 hours.  Labs inflam No results for input(s): CRP in the last 72 hours.  Invalid input(s): ESR  Labs coag No results for input(s): INR, PTT in the last 72 hours.  Invalid input(s): PT  No results for input(s): NA, K, CL,  CO2, GLUCOSE, BUN, CREATININE, CALCIUM in the last 72 hours.  Physical Exam: Vitals:   02/06/18 1142 02/06/18 1145  BP:    Pulse: (!) 59 61  Resp: 15 20  Temp:    SpO2: 100% 100%   General: Alert, no acute distress Cardiovascular: No pedal edema Respiratory: No cyanosis, no use of accessory musculature GI: No organomegaly, abdomen is soft and non-tender Skin: No lesions in the area of chief complaint other than those listed below in MSK exam.  Neurologic: Sensation intact distally save for the below mentioned MSK exam Psychiatric: Patient is competent for consent with normal mood and affect Lymphatic: No axillary or cervical lymphadenopathy  MUSCULOSKELETAL:  LUE: pain at 4th [facility], NVI Other extremities are atraumatic with painless ROM and NVI.  Assessment: L 4th displaced MC  Plan: ORIF today   Renette Butters, MD Cell [phone]   02/06/2018 12:25 PM

## 2018-02-06 NOTE — Op Note (Signed)
02/06/2018  1:43 PM  PATIENT:  Nicole ForgeAlexis N Rosenberry    PRE-OPERATIVE DIAGNOSIS:  FRACTURE METACARPAL BONE,CLOSED FRACTURE  POST-OPERATIVE DIAGNOSIS:  Same  PROCEDURE:  OPEN REDUCTION INTERNAL FIXATION (ORIF) 4TH METACARPAL  SURGEON:  Sheral Apleyimothy D Raeonna Milo, MD  ASSISTANT: none  ANESTHESIA:   gen  PREOPERATIVE INDICATIONS:  Nicole Forgelexis N Inskeep is a  27 y.o. female with a diagnosis of FRACTURE METACARPAL BONE,CLOSED FRACTURE who failed conservative measures and elected for surgical management.    The risks benefits and alternatives were discussed with the patient preoperatively including but not limited to the risks of infection, bleeding, nerve injury, cardiopulmonary complications, the need for revision surgery, among others, and the patient was willing to proceed.  OPERATIVE IMPLANTS: stryker hand plate  OPERATIVE FINDINGS: unstable fracture  BLOOD LOSS: min  COMPLICATIONS: none  TOURNIQUET TIME: 30min  OPERATIVE PROCEDURE:  Patient was identified in the preoperative holding area and site was marked by me She was transported to the operating theater and placed on the table in supine position taking care to pad all bony prominences. After a preincinduction time out anesthesia was induced. The left upper extremity was prepped and draped in normal sterile fashion and a pre-incision timeout was performed. She received ancef for preoperative antibiotics.   Made a dorsal incision centered over her fourth metacarpal fracture I protected all neurovascular structures.  Identified her extensor tendons and retracted these radially.  I then incised her periosteum and noted her displaced fourth metacarpal fracture I cleared of soft tissue and performed a reduction and was able to clamp this in the place I placed a lag screw distally.  Proximally I attempted to placed a second lag screw however the bone was too small so I did elect to place a small 4-hole plate here fixed it proximally and distally I did  not place the most proximal screw to avoid any penetration of the [facility].  Next I thoroughly irrigated the incision closed her skin with a nylon stitch.  Sterile dressings were applied she was placed in an ulnar gutter splint awoken taken to the PACU in stable condition  POST OPERATIVE PLAN: NWB, mobilize for dvt px

## 2018-02-06 NOTE — Discharge Instructions (Signed)
Discharge Instructions After Orthopedic Procedures:  *You may feel tired and weak following your procedure. It is recommended that you limit physical activity for the next 24 hours and rest at home for the remainder of today and tomorrow. *No strenuous activity should be started without your doctor's permission.  Elevate the extremity that you had surgery on to a level above your heart. This should continue for 48 hours or as instructed by your doctor.  If you had hand, arm or shoulder surgery you should move your fingers frequently unless otherwise instructed by your doctor.  If you had foot, knee or leg surgery you should wiggle your toes frequently unless otherwise instructed by your doctor.  Follow your doctor's exact instructions for activity at home. Use your home equipment as instructed. (Crutches, hard shoes, slings etc.)  Limit your activity as instructed by your doctor.  Report to your doctor should any of the following occur: 1. Extreme swelling of your fingers or toes. 2. Inability to wiggle your fingers or toes. 3. Coldness, pale or bluish color in your fingers or toes. 4. Loss of sensation, numbness or tingling of your fingers or toes. 5. Unusual smell or odor from under your dressing or cast. 6. Excessive bleeding or drainage from the surgical site. 7. Pain not relieved by medication your doctor has prescribed for you. 8. Cast or dressing too tight (do not get your dressing or cast wet or put anything under          your dressing or cast.)  *Do not change your dressing unless instructed by your doctor or discharge nurse. Then follow exact instructions.  *Follow labeled instructions for any medications that your doctor may have prescribed for you. *Should any questions or complications develop following your procedure, PLEASE CONTACT YOUR DOCTOR.      Post Anesthesia Home Care Instructions  Activity: Get plenty of rest for the remainder of the day. A responsible  individual must stay with you for 24 hours following the procedure.  For the next 24 hours, DO NOT: -Drive a car -Advertising copywriterperate machinery -Drink alcoholic beverages -Take any medication unless instructed by your physician -Make any legal decisions or sign important papers.  Meals: Start with liquid foods such as gelatin or soup. Progress to regular foods as tolerated. Avoid greasy, spicy, heavy foods. If nausea and/or vomiting occur, drink only clear liquids until the nausea and/or vomiting subsides. Call your physician if vomiting continues.  Special Instructions/Symptoms: Your throat may feel dry or sore from the anesthesia or the breathing tube placed in your throat during surgery. If this causes discomfort, gargle with warm salt water. The discomfort should disappear within 24 hours.  If you had a scopolamine patch placed behind your ear for the management of post- operative nausea and/or vomiting:  1. The medication in the patch is effective for 72 hours, after which it should be removed.  Wrap patch in a tissue and discard in the trash. Wash hands thoroughly with soap and water. 2. You may remove the patch earlier than 72 hours if you experience unpleasant side effects which may include dry mouth, dizziness or visual disturbances. 3. Avoid touching the patch. Wash your hands with soap and water after contact with the patch.   Keep your splint clean, dry and intactRegional Anesthesia Blocks  1. Numbness or the inability to move the "blocked" extremity may last from 3-48 hours after placement. The length of time depends on the medication injected and your individual response to the medication.  If the numbness is not going away after 48 hours, call your surgeon.  2. The extremity that is blocked will need to be protected until the numbness is gone and the  Strength has returned. Because you cannot feel it, you will need to take extra care to avoid injury. Because it may be weak, you may have  difficulty moving it or using it. You may not know what position it is in without looking at it while the block is in effect.  3. For blocks in the legs and feet, returning to weight bearing and walking needs to be done carefully. You will need to wait until the numbness is entirely gone and the strength has returned. You should be able to move your leg and foot normally before you try and bear weight or walk. You will need someone to be with you when you first try to ensure you do not fall and possibly risk injury.  4. Bruising and tenderness at the needle site are common side effects and will resolve in a few days.  5. Persistent numbness or new problems with movement should be communicated to the surgeon or the [facility] [phone])/ [facility] [phone]).

## 2018-02-06 NOTE — Addendum Note (Signed)
Addendum  created 02/06/18 1555 by Ronnette HilaPayne, Westin Knotts D, CRNA   Intraprocedure Meds edited

## 2018-02-06 NOTE — Anesthesia Postprocedure Evaluation (Signed)
Anesthesia Post Note  Patient: Pearletha Forgelexis N Masek  Procedure(s) Performed: OPEN REDUCTION INTERNAL FIXATION (ORIF) 4TH METACARPAL (Left Hand)     Patient location during evaluation: PACU Anesthesia Type: Regional Level of consciousness: awake and alert and oriented Pain management: pain level controlled Vital Signs Assessment: post-procedure vital signs reviewed and stable Respiratory status: spontaneous breathing, nonlabored ventilation and respiratory function stable Cardiovascular status: blood pressure returned to baseline and stable Postop Assessment: no apparent nausea or vomiting Anesthetic complications: no    Last Vitals:  Vitals:   02/06/18 1400 02/06/18 1415  BP: 117/89 113/73  Pulse: 67 (!) 50  Resp: 16 15  Temp:    SpO2: 100% 100%    Last Pain:  Vitals:   02/06/18 1415  TempSrc:   PainSc: 0-No pain                 Aeriana Speece A.

## 2018-02-06 NOTE — Transfer of Care (Signed)
Immediate Anesthesia Transfer of Care Note  Patient: Nicole Rose  Procedure(s) Performed: OPEN REDUCTION INTERNAL FIXATION (ORIF) 4TH METACARPAL (Left Hand)  Patient Location: PACU  Anesthesia Type:General  Level of Consciousness: awake, alert  and oriented  Airway & Oxygen Therapy: Patient Spontanous Breathing and Patient connected to face mask oxygen  Post-op Assessment: Report given to RN and Post -op Vital signs reviewed and stable  Post vital signs: Reviewed and stable  Last Vitals:  Vitals Value Taken Time  BP    Temp    Pulse 95 02/06/2018  1:49 PM  Resp    SpO2 100 % 02/06/2018  1:49 PM  Vitals shown include unvalidated device data.  Last Pain:  Vitals:   02/06/18 1101  TempSrc: Oral  PainSc: 6       Patients Stated Pain Goal: 4 (02/06/18 1101)  Complications: No apparent anesthesia complications

## 2018-02-06 NOTE — Anesthesia Procedure Notes (Signed)
Anesthesia Regional Block: Axillary brachial plexus block   Pre-Anesthetic Checklist: ,, timeout performed, Correct Patient, Correct Site, Correct Laterality, Correct Procedure, Correct Position, site marked, Risks and benefits discussed,  Surgical consent,  Pre-op evaluation,  At surgeon's request and post-op pain management  Laterality: Left  Prep: chloraprep       Needles:  Injection technique: Single-shot  Needle Type: Echogenic Stimulator Needle     Needle Length: 9cm  Needle Gauge: 21   Needle insertion depth: 5 cm   Additional Needles:   Procedures:,,,, ultrasound used (permanent image in chart),,,,  Narrative:  Start time: 02/06/2018 11:31 AM End time: 02/06/2018 11:36 AM Injection made incrementally with aspirations every 5 mL.  Performed by: Personally  Anesthesiologist: Mal AmabileFoster, Berklee Battey, MD  Additional Notes: Timeout performed. Patient sedated. Relevant anatomy ID'd using US. Incremental 2-565ml injection of LA with frequent aspiration. Patient tolerated procedure well.        Left Axillary Block

## 2018-02-06 NOTE — Progress Notes (Signed)
Assisted Dr. Foster with left, ultrasound guided, axillary block. Side rails up, monitors on throughout procedure. See vital signs in flow sheet. Tolerated Procedure well. 

## 2018-02-09 ENCOUNTER — Encounter (HOSPITAL_BASED_OUTPATIENT_CLINIC_OR_DEPARTMENT_OTHER): Payer: Self-pay | Admitting: Orthopedic Surgery

## 2018-03-16 ENCOUNTER — Ambulatory Visit (HOSPITAL_COMMUNITY)
Admission: EM | Admit: 2018-03-16 | Discharge: 2018-03-16 | Disposition: A | Discharge status: Home / Self Care | Payer: Medicaid Other | Attending: Family Medicine | Admitting: Family Medicine

## 2018-03-16 ENCOUNTER — Encounter (HOSPITAL_COMMUNITY): Payer: Self-pay | Admitting: Emergency Medicine

## 2018-03-16 ENCOUNTER — Other Ambulatory Visit: Payer: Self-pay

## 2018-03-16 DIAGNOSIS — B349 Viral infection, unspecified: Secondary | ICD-10-CM

## 2018-03-16 DIAGNOSIS — R51 Headache: Secondary | ICD-10-CM

## 2018-03-16 DIAGNOSIS — R0981 Nasal congestion: Secondary | ICD-10-CM

## 2018-03-16 DIAGNOSIS — R112 Nausea with vomiting, unspecified: Secondary | ICD-10-CM

## 2018-03-16 DIAGNOSIS — M7918 Myalgia, other site: Secondary | ICD-10-CM

## 2018-03-16 MED ORDER — ONDANSETRON 4 MG PO TBDP: 4.0000 mg | ORAL_TABLET | Freq: Three times a day (TID) | ORAL | 0 refills | Status: DC | PRN

## 2018-03-16 MED ORDER — CETIRIZINE HCL 10 MG PO TABS: 10.0000 mg | ORAL_TABLET | Freq: Every day | ORAL | 0 refills | Status: DC

## 2018-03-16 MED ORDER — ONDANSETRON 4 MG PO TBDP
ORAL_TABLET | ORAL | Status: AC
  Filled 2018-03-16: qty 1

## 2018-03-16 MED ORDER — KETOROLAC TROMETHAMINE 30 MG/ML IJ SOLN
INTRAMUSCULAR | Status: AC
  Filled 2018-03-16: qty 1

## 2018-03-16 MED ORDER — ONDANSETRON 4 MG PO TBDP
4.0000 mg | ORAL_TABLET | Freq: Once | ORAL | Status: AC
  Administered 2018-03-16: 4 mg via ORAL

## 2018-03-16 MED ORDER — BENZONATATE 100 MG PO CAPS: 100.0000 mg | ORAL_CAPSULE | Freq: Three times a day (TID) | ORAL | 0 refills | Status: DC

## 2018-03-16 MED ORDER — KETOROLAC TROMETHAMINE 30 MG/ML IJ SOLN
30.0000 mg | Freq: Once | INTRAMUSCULAR | Status: AC
  Administered 2018-03-16: 30 mg via INTRAMUSCULAR

## 2018-03-16 NOTE — ED Provider Notes (Signed)
[facility]    CSN: [id number] Arrival date & time: 03/16/18  1532     History   Chief Complaint Chief Complaint  Patient presents with  . URI    HPI Nicole Rose is a 28 y.o. female.   Patient is a 28 year old female that presents with nausea, vomiting, diarrhea, myalgias, cough, congestion.  This has been present for the past 5 days with waxing and waning symptoms.  Initially she started off with diarrhea that lasted approximate 2 days.  Reports very loose stools.  Denies any blood in stools.  Now she is having nausea, vomiting.  She has vomited approximate 4 times.  She has had low-grade fever.  Generalized abdominal discomfort and cramping.  She has had recent sick contacts with people that she lives around.  She has not been taking anything for her symptoms.  She denies any recent traveling.  ROS per HPI      Past Medical History:  Diagnosis Date  . Anemia    no current med.  . Metacarpal bone fracture 01/2018   left 4th  . Migraines   . Obesity     Patient Active Problem List   Diagnosis Date Noted  . Failed vision screen 10/30/2012  . Failed hearing screening 10/30/2012  . General counseling and advice for contraceptive management 10/30/2012  . Sleep disturbance 10/30/2012  . Dizziness 10/30/2012  . Adjustment disorder with mixed anxiety and depressed mood 10/30/2012  . Smoker 10/23/2012  . 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 Epidermal inclusion cyst 01/10/2012    Past Surgical History:  Procedure Laterality Date  . BREAST BIOPSY Right 09/28/2012   Procedure: EXCISION OF CHRONIC RIGHT BREAST ABSCESS;  Surgeon: Almond LintFaera Byerly, MD;  Location: [facility];  Service: General;  Laterality: Right;  . OPEN REDUCTION INTERNAL FIXATION (ORIF) METACARPAL Left 02/06/2018   Procedure: OPEN REDUCTION INTERNAL FIXATION (ORIF) 4TH METACARPAL;  Surgeon: Sheral ApleyMurphy, Timothy D, MD;  Location: [facility];  Service: Orthopedics;  Laterality: Left;     OB History   No obstetric history on file.      Home Medications    Prior to Admission medications   Medication Sig Start Date End Date Taking? Authorizing Provider  benzonatate (TESSALON) 100 MG capsule Take 1 capsule (100 mg total) by mouth every 8 (eight) hours. 03/16/18   Dahlia ByesBast, Shadana Pry A, NP  cetirizine (ZYRTEC) 10 MG tablet Take 1 tablet (10 mg total) by mouth daily. 03/16/18   Dahlia ByesBast, Graeson Nouri A, NP  ondansetron (ZOFRAN ODT) 4 MG disintegrating tablet Take 1 tablet (4 mg total) by mouth every 8 (eight) hours as needed for nausea or vomiting. 03/16/18   Janace ArisBast, Ruqayya Ventress A, NP    Family History Family History  Problem Relation Age of Onset  . Cancer Maternal Aunt        breast  . Cancer Paternal Aunt        lung  . Cancer Maternal Aunt        breast  . Cancer Maternal Aunt        ovarian  . Cancer Maternal Aunt        ovarian    Social History Social History   Tobacco Use  . Smoking status: Current Every Day Smoker    Packs/day: 0.00    Years: 7.00    Pack years: 0.00    Types: Cigarettes  . Smokeless tobacco: Never Used  . Tobacco comment: 6 cig./day  Substance Use Topics  . Alcohol  use: Yes    Comment: occasionally  . Drug use: No     Allergies   Latex   Review of Systems Review of Systems   Physical Exam Triage Vital Signs ED Triage Vitals  Enc Vitals Group     BP 03/16/18 1602 116/84     Pulse Rate 03/16/18 1602 89     Resp 03/16/18 1602 18     Temp 03/16/18 1602 99.1 F (37.3 C)     Temp Source 03/16/18 1602 Temporal     SpO2 03/16/18 1602 99 %     Weight --      Height --      Head Circumference --      Peak Flow --      Pain Score 03/16/18 1559 10     Pain Loc --      Pain Edu? --      Excl. in GC? --    No data found.  Updated Vital Signs BP 116/84 (BP Location: Right Arm)   Pulse 89   Temp 99.1 F (37.3 C) (Temporal)   Resp 18   LMP 02/25/2018   SpO2 99%   Visual Acuity Right Eye Distance:   Left Eye Distance:   Bilateral  Distance:    Right Eye Near:   Left Eye Near:    Bilateral Near:     Physical Exam Vitals signs and nursing note reviewed.  Constitutional:      General: She is not in acute distress.    Appearance: She is well-developed. She is not ill-appearing, toxic-appearing or diaphoretic.  HENT:     Head: Normocephalic and atraumatic.     Right Ear: Tympanic membrane and ear canal normal.     Left Ear: Tympanic membrane and ear canal normal.     Nose: Congestion and rhinorrhea present.     Mouth/Throat:     Pharynx: Posterior oropharyngeal erythema present.     Comments: PND  Eyes:     Conjunctiva/sclera: Conjunctivae normal.  Neck:     Musculoskeletal: Neck supple.  Cardiovascular:     Rate and Rhythm: Normal rate and regular rhythm.     Heart sounds: Normal heart sounds. No murmur.  Pulmonary:     Effort: Pulmonary effort is normal. No respiratory distress.     Breath sounds: Normal breath sounds.  Abdominal:     General: There is no distension.     Palpations: Abdomen is soft. There is no mass.     Tenderness: There is abdominal tenderness. There is no guarding or rebound.     Hernia: No hernia is present.     Comments: Generalized abdominal tenderness  Musculoskeletal: Normal range of motion.  Lymphadenopathy:     Cervical: No cervical adenopathy.  Skin:    General: Skin is warm and dry.     Findings: No rash.  Neurological:     Mental Status: She is alert.  Psychiatric:        Mood and Affect: Mood normal.      UC Treatments / Results  Labs (all labs ordered are listed, but only abnormal results are displayed) Labs Reviewed - No data to display  EKG None  Radiology No results found.  Procedures Procedures (including critical care time)  Medications Ordered in UC Medications  ketorolac (TORADOL) 30 MG/ML injection 30 mg (30 mg Intramuscular Given 03/16/18 1636)  ondansetron (ZOFRAN-ODT) disintegrating tablet 4 mg (4 mg Oral Given 03/16/18 1636)    Initial  Impression / Assessment  and Plan / UC Course  I have reviewed the triage vital signs and the nursing notes.  Pertinent labs & imaging results that were available during my care of the patient were reviewed by me and considered in my medical decision making (see chart for details).     Symptoms consistent with some sort of viral illness, maybe flu  Toradol given here in clinic for headache and body aches Zofran given for nausea, vomiting Instructed to stay hydrated Prescribed Tessalon Perles for cough Zofran for nausea vomiting as needed Zyrtec for congestion and drainage Follow up as needed for continued or worsening symptoms  Final Clinical Impressions(s) / UC Diagnoses   Final diagnoses:  Viral syndrome     Discharge Instructions     I believe this is a viral illness or flulike illness. Toradol injection given here for headache and Zofran for nausea, vomiting I am sending some prescription for Tessalon Perles for cough, Zyrtec for congestion and drainage and Zofran every 8 hours as needed for nausea, vomiting Make sure you are sipping fluids to include water or Gatorade. Follow up as needed for continued or worsening symptoms     ED Prescriptions    Medication Sig Dispense Auth. Provider   ondansetron (ZOFRAN ODT) 4 MG disintegrating tablet  (Status: Discontinued) Take 1 tablet (4 mg total) by mouth every 8 (eight) hours as needed for nausea or vomiting. 20 tablet Manases Etchison A, NP   benzonatate (TESSALON) 100 MG capsule  (Status: Discontinued) Take 1 capsule (100 mg total) by mouth every 8 (eight) hours. 21 capsule Mandy Fitzwater A, NP   cetirizine (ZYRTEC) 10 MG tablet  (Status: Discontinued) Take 1 tablet (10 mg total) by mouth daily. 30 tablet Fynley Chrystal A, NP   benzonatate (TESSALON) 100 MG capsule Take 1 capsule (100 mg total) by mouth every 8 (eight) hours. 21 capsule Chaka Jefferys A, NP   cetirizine (ZYRTEC) 10 MG tablet Take 1 tablet (10 mg total) by mouth daily. 30  tablet Blade Scheff A, NP   ondansetron (ZOFRAN ODT) 4 MG disintegrating tablet Take 1 tablet (4 mg total) by mouth every 8 (eight) hours as needed for nausea or vomiting. 20 tablet Dahlia Byes A, NP     Controlled Substance Prescriptions [place] Controlled Substance Registry consulted? Not Applicable   Janace Aris, NP 03/16/18 1711

## 2018-03-16 NOTE — Discharge Instructions (Signed)
I believe this is a viral illness or flulike illness. Toradol injection given here for headache and Zofran for nausea, vomiting I am sending some prescription for Tessalon Perles for cough, Zyrtec for congestion and drainage and Zofran every 8 hours as needed for nausea, vomiting Make sure you are sipping fluids to include water or Gatorade. Follow up as needed for continued or worsening symptoms

## 2018-03-16 NOTE — ED Triage Notes (Signed)
Onset 3 days ago, worsening today.  Patient has body aches, throat sore, diarrhea for 2 days, headache and started vomiting today.

## 2018-05-18 ENCOUNTER — Encounter (HOSPITAL_COMMUNITY): Payer: Self-pay

## 2018-05-18 ENCOUNTER — Other Ambulatory Visit: Payer: Self-pay

## 2018-05-18 ENCOUNTER — Ambulatory Visit (HOSPITAL_COMMUNITY)
Admission: EM | Admit: 2018-05-18 | Discharge: 2018-05-18 | Disposition: A | Discharge status: Home / Self Care | Payer: Medicaid Other | Attending: Family Medicine | Admitting: Family Medicine

## 2018-05-18 DIAGNOSIS — J309 Allergic rhinitis, unspecified: Secondary | ICD-10-CM

## 2018-05-18 DIAGNOSIS — J069 Acute upper respiratory infection, unspecified: Secondary | ICD-10-CM

## 2018-05-18 DIAGNOSIS — R0789 Other chest pain: Secondary | ICD-10-CM

## 2018-05-18 MED ORDER — OLOPATADINE HCL 0.1 % OP SOLN: 1.0000 [drp] | Freq: Two times a day (BID) | OPHTHALMIC | 12 refills | Status: DC

## 2018-05-18 MED ORDER — FLUTICASONE PROPIONATE 50 MCG/ACT NA SUSP: 1.0000 | Freq: Every day | NASAL | 0 refills | Status: DC

## 2018-05-18 NOTE — Discharge Instructions (Signed)
Continue Zyrtec Begin using Flonase nasal spray 1 to 2 spray in each nostril daily Olopatadine eyedrops twice daily and eyes as needed for itching/drainage Please use Tylenol for chest discomfort, may supplement with ibuprofen if needed Please stay home over the next couple days to ensure symptoms resolving with the above, if turning more into fevers, cough please stay home longer until the symptoms fully resolve.  Please follow-up if developing worsening chest discomfort, shortness of breath, fevers or cough

## 2018-05-18 NOTE — ED Provider Notes (Addendum)
[facility]    CSN: [id number] Arrival date & time: 05/18/18  1416     History   Chief Complaint Chief Complaint  Patient presents with  . Allergies    HPI Nicole Rose is a 28 y.o. female history of tobacco use, migraines presenting today for evaluation of congestion and shortness of breath.  Patient states that for the past 4 to 5 days she has noticed her allergies have flared up.  She has been taking Zyrtec daily.  Has also had eye itching and redness.  She has had some mild rhinorrhea.  Denies sore throat, denies cough.  Today she had a couple episodes of chest discomfort that went to her back with associated shortness of breath.  She is not feeling the sensation currently at time of her visit.  She denies any recent fevers, body aches, chills.Denies any recent travel, denies any known exposure to COVID-19.  Denies close contacts with symptoms concerning for COVID-19.  HPI  Past Medical History:  Diagnosis Date  . Anemia    no current med.  . Metacarpal bone fracture 01/2018   left 4th  . Migraines   . Obesity     Patient Active Problem List   Diagnosis Date Noted  . Failed vision screen 10/30/2012  . Failed hearing screening 10/30/2012  . General counseling and advice for contraceptive management 10/30/2012  . Sleep disturbance 10/30/2012  . Dizziness 10/30/2012  . Adjustment disorder with mixed anxiety and depressed mood 10/30/2012  . Smoker 10/23/2012  . 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 Epidermal inclusion cyst 01/10/2012    Past Surgical History:  Procedure Laterality Date  . BREAST BIOPSY Right 09/28/2012   Procedure: EXCISION OF CHRONIC RIGHT BREAST ABSCESS;  Surgeon: Almond Lint, MD;  Location: [facility];  Service: General;  Laterality: Right;  . OPEN REDUCTION INTERNAL FIXATION (ORIF) METACARPAL Left 02/06/2018   Procedure: OPEN REDUCTION INTERNAL FIXATION (ORIF) 4TH METACARPAL;  Surgeon: Sheral Apley, MD;  Location: [facility];  Service: Orthopedics;  Laterality: Left;    OB History   No obstetric history on file.      Home Medications    Prior to Admission medications   Medication Sig Start Date End Date Taking? Authorizing Provider  cetirizine (ZYRTEC) 10 MG tablet Take 1 tablet (10 mg total) by mouth daily. 03/16/18  Yes Bast, Traci A, NP  fluticasone (FLONASE) 50 MCG/ACT nasal spray Place 1-2 sprays into both nostrils daily for 7 days. 05/18/18 05/25/18  Jaxsen Bernhart C, PA-C  olopatadine (PATANOL) 0.1 % ophthalmic solution Place 1 drop into both eyes 2 (two) times daily. 05/18/18   Alister Staver, Junius Creamer, PA-C    Family History Family History  Problem Relation Age of Onset  . Cancer Maternal Aunt        breast  . Cancer Paternal Aunt        lung  . Cancer Maternal Aunt        breast  . Cancer Maternal Aunt        ovarian  . Cancer Maternal Aunt        ovarian    Social History Social History   Tobacco Use  . Smoking status: Current Some Day Smoker    Packs/day: 0.00    Years: 7.00    Pack years: 0.00    Types: Cigarettes  . Smokeless tobacco: Never Used  . Tobacco comment: 6 cig./day  Substance Use Topics  . Alcohol use: Yes  Comment: occasionally  . Drug use: No     Allergies   Latex   Review of Systems Review of Systems  Constitutional: Negative for activity change, appetite change, chills, fatigue and fever.  HENT: Positive for congestion and rhinorrhea. Negative for ear pain, sinus pressure, sore throat and trouble swallowing.   Eyes: Negative for discharge and redness.  Respiratory: Positive for shortness of breath. Negative for cough and chest tightness.   Cardiovascular: Positive for chest pain.  Gastrointestinal: Negative for abdominal pain, diarrhea, nausea and vomiting.  Musculoskeletal: Negative for myalgias.  Skin: Negative for rash.  Neurological: Negative for dizziness, light-headedness and headaches.     Physical Exam Triage Vital Signs ED  Triage Vitals  Enc Vitals Group     BP 05/18/18 1430 112/74     Pulse Rate 05/18/18 1430 (!) 58     Resp 05/18/18 1430 17     Temp 05/18/18 1430 98.1 F (36.7 C)     Temp Source 05/18/18 1430 Oral     SpO2 05/18/18 1430 98 %     Weight --      Height --      Head Circumference --      Peak Flow --      Pain Score 05/18/18 1428 7     Pain Loc --      Pain Edu? --      Excl. in GC? --    No data found.  Updated Vital Signs BP 112/74 (BP Location: Left Arm)   Pulse (!) 58   Temp 98.1 F (36.7 C) (Oral)   Resp 17   LMP 05/12/2018   SpO2 98%   Visual Acuity Right Eye Distance:   Left Eye Distance:   Bilateral Distance:    Right Eye Near:   Left Eye Near:    Bilateral Near:     Physical Exam Vitals signs and nursing note reviewed.  Constitutional:      General: She is not in acute distress.    Appearance: She is well-developed.  HENT:     Head: Normocephalic and atraumatic.     Ears:     Comments: Bilateral ears without tenderness to palpation of external auricle, tragus and mastoid, EAC's without erythema or swelling, TM's with good bony landmarks and cone of light. Non erythematous.    Mouth/Throat:     Comments: Oral mucosa pink and moist, no tonsillar enlargement or exudate. Posterior pharynx patent and nonerythematous, no uvula deviation or swelling. Normal phonation. Eyes:     Extraocular Movements: Extraocular movements intact.     Conjunctiva/sclera: Conjunctivae normal.     Pupils: Pupils are equal, round, and reactive to light.     Comments: Right eye appears slightly erythematous, occasionally rubbing  Neck:     Musculoskeletal: Neck supple.  Cardiovascular:     Rate and Rhythm: Normal rate and regular rhythm.     Heart sounds: No murmur.  Pulmonary:     Effort: Pulmonary effort is normal. No respiratory distress.     Breath sounds: Normal breath sounds.     Comments: Breathing comfortably at rest, CTABL, no wheezing, rales or other adventitious  sounds auscultated  Anterior chest tender to palpation throughout left side Abdominal:     Palpations: Abdomen is soft.     Tenderness: There is no abdominal tenderness.  Musculoskeletal:     Comments: Bilateral thoracic musculature tender to palpation  Skin:    General: Skin is warm and dry.  Neurological:  Mental Status: She is alert.      UC Treatments / Results  Labs (all labs ordered are listed, but only abnormal results are displayed) Labs Reviewed - No data to display  EKG None  Radiology No results found.  Procedures Procedures (including critical care time)  Medications Ordered in UC Medications - No data to display  Initial Impression / Assessment and Plan / UC Course  I have reviewed the triage vital signs and the nursing notes.  Pertinent labs & imaging results that were available during my care of the patient were reviewed by me and considered in my medical decision making (see chart for details).     Patient with upper respiratory symptoms of congestion, rhinnorhea x4 to 5 days, no cough.  No fever, tachycardia cardia or hypoxia.  Chest discomfort reproducible on exam, lungs clear.  Feel patient symptoms are low risk for COVID-19 and more likely allergic rhinitis, as patient lacking associated systemic symptoms of fever, body aches or chills.  Do not suspect underlying pneumonia at this time.  Will recommend symptomatic and supportive care.  Anti-inflammatories for chest discomfort.  Continue to monitor breathing, temperature, Discussed strict return precautions. Patient verbalized understanding and is agreeable with plan.  Final Clinical Impressions(s) / UC Diagnoses   Final diagnoses:  Allergic rhinitis, unspecified seasonality, unspecified trigger     Discharge Instructions     Continue Zyrtec Begin using Flonase nasal spray 1 to 2 spray in each nostril daily Olopatadine eyedrops twice daily and eyes as needed for itching/drainage Please use  Tylenol for chest discomfort, may supplement with ibuprofen if needed Please stay home over the next couple days to ensure symptoms resolving with the above, if turning more into fevers, cough please stay home longer until the symptoms fully resolve.  Please follow-up if developing worsening chest discomfort, shortness of breath, fevers or cough   ED Prescriptions    Medication Sig Dispense Auth. Provider   fluticasone (FLONASE) 50 MCG/ACT nasal spray Place 1-2 sprays into both nostrils daily for 7 days. 1 g Michell Kader C, PA-C   olopatadine (PATANOL) 0.1 % ophthalmic solution Place 1 drop into both eyes 2 (two) times daily. 5 mL Telly Jawad C, PA-C     Controlled Substance Prescriptions [place] Controlled Substance Registry consulted? Not Applicable   Lew Dawes, PA-C 05/18/18 [address] [place] 05/19/18 [phone]

## 2018-05-18 NOTE — ED Triage Notes (Signed)
Patient presents to Urgent Care with complaints of nasal congestion, eye redness and itching, and chest tightness with deep breaths since yesterday. Patient states she has seasonal allergies and they began causing sx yesterday. Pt has taken ibuprofen and zyrtec today with no relief.

## 2018-05-18 NOTE — ED Notes (Signed)
Patient verbalizes understanding of discharge instructions. Opportunity for questioning and answers were provided. Patient discharged from [facility] by APP.  

## 2019-05-11 ENCOUNTER — Ambulatory Visit (HOSPITAL_COMMUNITY)
Admission: EM | Admit: 2019-05-11 | Discharge: 2019-05-11 | Disposition: A | Discharge status: Home / Self Care | Payer: Self-pay | Attending: Family Medicine | Admitting: Family Medicine

## 2019-05-11 ENCOUNTER — Other Ambulatory Visit: Payer: Self-pay

## 2019-05-11 ENCOUNTER — Ambulatory Visit (INDEPENDENT_AMBULATORY_CARE_PROVIDER_SITE_OTHER): Payer: Self-pay

## 2019-05-11 ENCOUNTER — Encounter (HOSPITAL_COMMUNITY): Payer: Self-pay

## 2019-05-11 DIAGNOSIS — S60011A Contusion of right thumb without damage to nail, initial encounter: Secondary | ICD-10-CM

## 2019-05-11 DIAGNOSIS — M79644 Pain in right finger(s): Secondary | ICD-10-CM

## 2019-05-11 MED ORDER — ACETAMINOPHEN 500 MG PO TABS: 500.0000 mg | ORAL_TABLET | Freq: Four times a day (QID) | ORAL | 0 refills | Status: DC | PRN

## 2019-05-11 MED ORDER — IBUPROFEN 600 MG PO TABS: 600.0000 mg | ORAL_TABLET | Freq: Four times a day (QID) | ORAL | 0 refills | Status: DC | PRN

## 2019-05-11 NOTE — ED Provider Notes (Signed)
[facility]    CSN: [id number] Arrival date & time: 05/11/19  1939      History   Chief Complaint Chief Complaint  Patient presents with  . Thumb Injury    HPI Nicole Rose is a 29 y.o. female.   Patient reports to urgent care for right thumb pain.  She reports she injured it at work.  She reports he got slammed in the grill press.  She is unsure whether got burned she does notice a lot of pain in the end of her thumb.  She has minimal range of motion of the thumb.     Past Medical History:  Diagnosis Date  . Anemia    no current med.  . Metacarpal bone fracture 01/2018   left 4th  . Migraines   . Obesity     Patient Active Problem List   Diagnosis Date Noted  . Failed vision screen 10/30/2012  . Failed hearing screening 10/30/2012  . General counseling and advice for contraceptive management 10/30/2012  . Sleep disturbance 10/30/2012  . Dizziness 10/30/2012  . Adjustment disorder with mixed anxiety and depressed mood 10/30/2012  . Smoker 10/23/2012  . 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 Epidermal inclusion cyst 01/10/2012    Past Surgical History:  Procedure Laterality Date  . BREAST BIOPSY Right 09/28/2012   Procedure: EXCISION OF CHRONIC RIGHT BREAST ABSCESS;  Surgeon: Stark Klein, MD;  Location: [facility];  Service: General;  Laterality: Right;  . OPEN REDUCTION INTERNAL FIXATION (ORIF) METACARPAL Left 02/06/2018   Procedure: OPEN REDUCTION INTERNAL FIXATION (ORIF) 4TH METACARPAL;  Surgeon: Renette Butters, MD;  Location: [place];  Service: Orthopedics;  Laterality: Left;    OB History   No obstetric history on file.      Home Medications    Prior to Admission medications   Medication Sig Start Date End Date Taking? Authorizing Provider  acetaminophen (TYLENOL) 500 MG tablet Take 1 tablet (500 mg total) by mouth every 6 (six) hours as needed. 05/11/19   Ori Kreiter, Marguerita Beards, PA-C  cetirizine (ZYRTEC) 10 MG tablet Take 1  tablet (10 mg total) by mouth daily. 03/16/18   Bast, Tressia Miners A, NP  fluticasone (FLONASE) 50 MCG/ACT nasal spray Place 1-2 sprays into both nostrils daily for 7 days. 05/18/18 05/25/18  Wieters, Hallie C, PA-C  ibuprofen (ADVIL) 600 MG tablet Take 1 tablet (600 mg total) by mouth every 6 (six) hours as needed. 05/11/19   Carnelia Oscar, Marguerita Beards, PA-C  olopatadine (PATANOL) 0.1 % ophthalmic solution Place 1 drop into both eyes 2 (two) times daily. 05/18/18   Wieters, Elesa Hacker, PA-C    Family History Family History  Problem Relation Age of Onset  . Cancer Maternal Aunt        breast  . Cancer Paternal Aunt        lung  . Cancer Maternal Aunt        breast  . Cancer Maternal Aunt        ovarian  . Cancer Maternal Aunt        ovarian    Social History Social History   Tobacco Use  . Smoking status: Current Some Day Smoker    Packs/day: 0.00    Years: 7.00    Pack years: 0.00    Types: Cigarettes  . Smokeless tobacco: Never Used  . Tobacco comment: 6 cig./day  Substance Use Topics  . Alcohol use: Yes    Comment: occasionally  .  Drug use: No     Allergies   Latex   Review of Systems Review of Systems   Physical Exam Triage Vital Signs ED Triage Vitals [05/11/19 2014]  Enc Vitals Group     BP 126/67     Pulse Rate 66     Resp 16     Temp 98.2 F (36.8 C)     Temp Source Oral     SpO2 100 %     Weight      Height      Head Circumference      Peak Flow      Pain Score      Pain Loc      Pain Edu?      Excl. in GC?    No data found.  Updated Vital Signs BP 126/67 (BP Location: Left Arm)   Pulse 66   Temp 98.2 F (36.8 C) (Oral)   Resp 16   LMP 05/08/2019 (Exact Date)   SpO2 100%   Visual Acuity Right Eye Distance:   Left Eye Distance:   Bilateral Distance:    Right Eye Near:   Left Eye Near:    Bilateral Near:     Physical Exam Vitals and nursing note reviewed.  Constitutional:      General: She is not in acute distress.    Appearance: Normal  appearance. She is well-developed. She is not ill-appearing.  HENT:     Head: Normocephalic and atraumatic.  Eyes:     Conjunctiva/sclera: Conjunctivae normal.  Cardiovascular:     Rate and Rhythm: Normal rate.  Pulmonary:     Effort: Pulmonary effort is normal. No respiratory distress.  Musculoskeletal:     Cervical back: Neck supple.     Comments: Distal thumb with ecchymosis and subungual ecchymosis.  Unable to flex at the interphalangeal joint.  Sensation intact.  Cap refill intact  Skin:    General: Skin is warm and dry.     Comments: No evidence of burn or skin tenderness.  Neurological:     Mental Status: She is alert.      UC Treatments / Results  Labs (all labs ordered are listed, but only abnormal results are displayed) Labs Reviewed - No data to display  EKG   Radiology DG Finger Thumb Right  Result Date: 05/11/2019 CLINICAL DATA:  Pain and swelling of the right thumb secondary to a crush injury. Bruising. EXAM: RIGHT THUMB 2+V COMPARISON:  None. FINDINGS: There is no evidence of fracture or dislocation. There is no evidence of arthropathy or other focal bone abnormality. Soft tissues are unremarkable. IMPRESSION: Negative. Electronically Signed   By: Francene Boyers M.D.   On: 05/11/2019 20:46    Procedures Procedures (including critical care time)  Medications Ordered in UC Medications - No data to display  Initial Impression / Assessment and Plan / UC Course  I have reviewed the triage vital signs and the nursing notes.  Pertinent labs & imaging results that were available during my care of the patient were reviewed by me and considered in my medical decision making (see chart for details).     #Thumb contusion Patient presents for acute thumb injury.  There is no fracture.  Believe this is just a contusion.  Discussed that there is likely going to be discoloration of the nail thumb bed.  Also discussed that there is possibility that the nail does come  off however this is unlikely.  Discussed return precautions of significantly increasing  pain in 3 to 5 days.  Discussed early mobilization and pain management with ibuprofen and Tylenol. Final Clinical Impressions(s) / UC Diagnoses   Final diagnoses:  Contusion of right thumb without damage to nail, initial encounter     Discharge Instructions     No sign of fracture.  Believe that he simply bruised the thumb.  Some discoloration at the nail will continue and is normal.  There is a chance that your nail may fall off.  This is normal and will likely grow back.  If the thumb becomes a lot more painful in the next 3 to 4 days please return.  You may tape the thumb for support however I do want you to mobilize it.  Take the ibuprofen and Tylenol.  You may take these every 6 hours as directed.  May ice the thumb tonight  You may return to work so long as you can perform her duties.      ED Prescriptions    Medication Sig Dispense Auth. Provider   ibuprofen (ADVIL) 600 MG tablet Take 1 tablet (600 mg total) by mouth every 6 (six) hours as needed. 30 tablet Kymber Kosar, Veryl Speak, PA-C   acetaminophen (TYLENOL) 500 MG tablet Take 1 tablet (500 mg total) by mouth every 6 (six) hours as needed. 30 tablet Katsumi Wisler, Veryl Speak, PA-C     PDMP not reviewed this encounter.   Hermelinda Medicus, PA-C 05/11/19 2055

## 2019-05-11 NOTE — ED Triage Notes (Signed)
Pt presents with right thumb injury after smashing it in the grill press at work this evening.

## 2019-05-11 NOTE — Discharge Instructions (Signed)
No sign of fracture.  Believe that he simply bruised the thumb.  Some discoloration at the nail will continue and is normal.  There is a chance that your nail may fall off.  This is normal and will likely grow back.  If the thumb becomes a lot more painful in the next 3 to 4 days please return.  You may tape the thumb for support however I do want you to mobilize it.  Take the ibuprofen and Tylenol.  You may take these every 6 hours as directed.  May ice the thumb tonight  You may return to work so long as you can perform her duties.

## 2019-05-13 ENCOUNTER — Ambulatory Visit (HOSPITAL_COMMUNITY)
Admission: EM | Admit: 2019-05-13 | Discharge: 2019-05-13 | Disposition: A | Discharge status: Home / Self Care | Payer: Medicaid Other

## 2019-05-13 ENCOUNTER — Emergency Department (HOSPITAL_COMMUNITY)
Admission: EM | Admit: 2019-05-13 | Discharge: 2019-05-13 | Disposition: A | Discharge status: Home / Self Care | Payer: Medicaid Other | Attending: Emergency Medicine | Admitting: Emergency Medicine

## 2019-05-13 ENCOUNTER — Encounter (HOSPITAL_COMMUNITY): Payer: Self-pay | Admitting: Pharmacy Technician

## 2019-05-13 ENCOUNTER — Other Ambulatory Visit: Payer: Self-pay

## 2019-05-13 DIAGNOSIS — Z79899 Other long term (current) drug therapy: Secondary | ICD-10-CM | POA: Insufficient documentation

## 2019-05-13 DIAGNOSIS — R112 Nausea with vomiting, unspecified: Secondary | ICD-10-CM

## 2019-05-13 DIAGNOSIS — Z91040 Latex allergy status: Secondary | ICD-10-CM | POA: Insufficient documentation

## 2019-05-13 DIAGNOSIS — Z720 Tobacco use: Secondary | ICD-10-CM | POA: Insufficient documentation

## 2019-05-13 DIAGNOSIS — R197 Diarrhea, unspecified: Secondary | ICD-10-CM | POA: Insufficient documentation

## 2019-05-13 LAB — CBC
HCT: 43.9 % (ref 36.0–46.0)
Hemoglobin: 13.9 g/dL (ref 12.0–15.0)
MCH: 30.2 pg (ref 26.0–34.0)
MCHC: 31.7 g/dL (ref 30.0–36.0)
MCV: 95.4 fL (ref 80.0–100.0)
Platelets: 294 10*3/uL (ref 150–400)
RBC: 4.6 MIL/uL (ref 3.87–5.11)
RDW: 13.2 % (ref 11.5–15.5)
WBC: 10.4 10*3/uL (ref 4.0–10.5)
nRBC: 0 % (ref 0.0–0.2)

## 2019-05-13 LAB — COMPREHENSIVE METABOLIC PANEL
ALT: 18 U/L (ref 0–44)
AST: 21 U/L (ref 15–41)
Albumin: 4 g/dL (ref 3.5–5.0)
Alkaline Phosphatase: 84 U/L (ref 38–126)
Anion gap: 12 (ref 5–15)
BUN: 8 mg/dL (ref 6–20)
CO2: 20 mmol/L — ABNORMAL LOW (ref 22–32)
Calcium: 10 mg/dL (ref 8.9–10.3)
Chloride: 109 mmol/L (ref 98–111)
Creatinine, Ser: 0.85 mg/dL (ref 0.44–1.00)
GFR calc Af Amer: >60 mL/min (ref >60)
GFR calc non Af Amer: >60 mL/min (ref >60)
Glucose, Bld: 116 mg/dL — ABNORMAL HIGH (ref 70–99)
Potassium: 4 mmol/L (ref 3.5–5.1)
Sodium: 141 mmol/L (ref 135–145)
Total Bilirubin: 0.6 mg/dL (ref 0.3–1.2)
Total Protein: 7.9 g/dL (ref 6.5–8.1)

## 2019-05-13 LAB — I-STAT BETA HCG BLOOD, ED (MC, WL, AP ONLY): I-stat hCG, quantitative: <5 m[IU]/mL (ref <5)

## 2019-05-13 LAB — LIPASE, BLOOD: Lipase: 19 U/L (ref 11–51)

## 2019-05-13 MED ORDER — ONDANSETRON HCL 4 MG/2ML IJ SOLN
4.0000 mg | Freq: Once | INTRAMUSCULAR | Status: AC
  Administered 2019-05-13: 4 mg via INTRAVENOUS
  Filled 2019-05-13: qty 2

## 2019-05-13 MED ORDER — ONDANSETRON 4 MG PO TBDP: 4.0000 mg | ORAL_TABLET | Freq: Three times a day (TID) | ORAL | 0 refills | Status: DC | PRN

## 2019-05-13 MED ORDER — LOPERAMIDE HCL 2 MG PO CAPS: 2.0000 mg | ORAL_CAPSULE | Freq: Four times a day (QID) | ORAL | 0 refills | Status: DC | PRN

## 2019-05-13 MED ORDER — SODIUM CHLORIDE 0.9% FLUSH
3.0000 mL | Freq: Once | INTRAVENOUS | Status: AC
  Administered 2019-05-13: 3 mL via INTRAVENOUS

## 2019-05-13 MED ORDER — SODIUM CHLORIDE 0.9 % IV BOLUS
1000.0000 mL | Freq: Once | INTRAVENOUS | Status: AC
  Administered 2019-05-13: 1000 mL via INTRAVENOUS

## 2019-05-13 NOTE — ED Notes (Signed)
Pt called GF in the parking lot while laying on the floor in the waiting room bathroom floor-- assisted to W/C, transferred onto stretcher in triage-- pt is actively gagging, and is incontinent of urine/stool at same time-- moaning with pain.

## 2019-05-13 NOTE — ED Notes (Signed)
EDP at bedside  

## 2019-05-13 NOTE — ED Notes (Signed)
Patient verbalizes understanding of discharge instructions. Opportunity for questioning and answers were provided. Armband removed by staff, pt discharged from ED to home with mother. Ambulatory but provided WC for DC for safety and comfort

## 2019-05-13 NOTE — ED Triage Notes (Addendum)
Pt arrives pov with emesis onset today. Pt somnolent. LMP 1 week ago. Endorses marijuana use last night.

## 2019-05-13 NOTE — Discharge Instructions (Signed)
Please follow-up with your primary care provider regarding today's encounter.  Please take your medications, as prescribed.  Discontinue marijuana, at least till symptoms improve.  It is vitally important that you replace fluids lost through vomiting and loose stools.  Hydration is key.  Return to the ED or seek immediate medical attention for any new or worsening symptoms.

## 2019-05-13 NOTE — ED Provider Notes (Addendum)
[facility] EMERGENCY DEPARTMENT Provider Note   CSN: [id number] Arrival date & time: 05/13/19  1359     History Chief Complaint  Patient presents with  . Emesis    Nicole Rose is a 29 y.o. female PMH of migraines, anemia, and recent thumb contusion for which she was evaluated in urgent care 05/11/2019 who presents to the ED with 1 day history of nausea and nonbloody emesis.  Patient reports that she went to work last evening at the [employer] and felt entirely fine when she went to bed.  However, shortly after waking up this morning at approximately 6:30 AM, she developed acute onset nausea, crampy abdominal discomfort, nonbloody emesis, and loose, nonbloody stools.  She states that she tried to "power through" and go to work, however her symptoms of nausea were intractable prompting her to come to the ED for evaluation.  She did feel mildly lightheaded after several episodes of emesis.  She denies any focal abdominal pain, chest pain or difficulty breathing, headache or room spinning dizziness, vaginal discharge, vaginal pain, hematuria, or other urinary symptoms.  Patient admits to regular marijuana use, most recently yesterday, but denies any other illicit drug use or significant alcohol use.  HPI     Past Medical History:  Diagnosis Date  . Anemia    no current med.  . Metacarpal bone fracture 01/2018   left 4th  . Migraines   . Obesity     Patient Active Problem List   Diagnosis Date Noted  . Failed vision screen 10/30/2012  . Failed hearing screening 10/30/2012  . General counseling and advice for contraceptive management 10/30/2012  . Sleep disturbance 10/30/2012  . Dizziness 10/30/2012  . Adjustment disorder with mixed anxiety and depressed mood 10/30/2012  . Smoker 10/23/2012  . 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 Epidermal inclusion cyst 01/10/2012    Past Surgical History:  Procedure Laterality Date  . BREAST BIOPSY Right 09/28/2012     Procedure: EXCISION OF CHRONIC RIGHT BREAST ABSCESS;  Surgeon: Almond Lint, MD;  Location: [facility];  Service: General;  Laterality: Right;  . OPEN REDUCTION INTERNAL FIXATION (ORIF) METACARPAL Left 02/06/2018   Procedure: OPEN REDUCTION INTERNAL FIXATION (ORIF) 4TH METACARPAL;  Surgeon: Sheral Apley, MD;  Location: [facility];  Service: Orthopedics;  Laterality: Left;     OB History   No obstetric history on file.     Family History  Problem Relation Age of Onset  . Cancer Maternal Aunt        breast  . Cancer Paternal Aunt        lung  . Cancer Maternal Aunt        breast  . Cancer Maternal Aunt        ovarian  . Cancer Maternal Aunt        ovarian    Social History   Tobacco Use  . Smoking status: Current Some Day Smoker    Packs/day: 0.00    Years: 7.00    Pack years: 0.00    Types: Cigarettes  . Smokeless tobacco: Never Used  . Tobacco comment: 6 cig./day  Substance Use Topics  . Alcohol use: Yes    Comment: occasionally  . Drug use: No    Home Medications Prior to Admission medications   Medication Sig Start Date End Date Taking? Authorizing Provider  acetaminophen (TYLENOL) 500 MG tablet Take 1 tablet (500 mg total) by mouth every 6 (six) hours as needed.  05/11/19   Darr, Veryl Speak, PA-C  cetirizine (ZYRTEC) 10 MG tablet Take 1 tablet (10 mg total) by mouth daily. 03/16/18   Bast, Gloris Manchester A, NP  fluticasone (FLONASE) 50 MCG/ACT nasal spray Place 1-2 sprays into both nostrils daily for 7 days. 05/18/18 05/25/18  Wieters, Hallie C, PA-C  ibuprofen (ADVIL) 600 MG tablet Take 1 tablet (600 mg total) by mouth every 6 (six) hours as needed. 05/11/19   Darr, Veryl Speak, PA-C  loperamide (IMODIUM) 2 MG capsule Take 1 capsule (2 mg total) by mouth 4 (four) times daily as needed for diarrhea or loose stools. 05/13/19   Lorelee New, PA-C  olopatadine (PATANOL) 0.1 % ophthalmic solution Place 1 drop into both eyes 2 (two) times daily. 05/18/18   Wieters, Hallie  C, PA-C  ondansetron (ZOFRAN ODT) 4 MG disintegrating tablet Take 1 tablet (4 mg total) by mouth every 8 (eight) hours as needed for nausea or vomiting. 05/13/19   Lorelee New, PA-C    Allergies    Latex  Review of Systems   Review of Systems  Constitutional: Negative for chills and fever.  Respiratory: Negative for shortness of breath.   Cardiovascular: Negative for chest pain.  Gastrointestinal: Positive for diarrhea, nausea and vomiting.    Physical Exam Updated Vital Signs BP 114/72   Pulse 76   Temp 98 F (36.7 C) (Oral)   Resp 20   Ht 5\' 7"  (1.702 m)   Wt 81.6 kg   LMP 05/08/2019 (Exact Date)   SpO2 100%   BMI 28.19 kg/m   Physical Exam Vitals and nursing note reviewed. Exam conducted with a chaperone present.  Constitutional:      Comments: Uncomfortable.  HENT:     Head: Normocephalic and atraumatic.  Eyes:     General: No scleral icterus.    Conjunctiva/sclera: Conjunctivae normal.  Cardiovascular:     Rate and Rhythm: Normal rate and regular rhythm.     Pulses: Normal pulses.     Heart sounds: Normal heart sounds.  Pulmonary:     Effort: Pulmonary effort is normal. No respiratory distress.     Breath sounds: Normal breath sounds.  Abdominal:     Comments: Soft, nondistended.  No focal TTP.  No guarding.  No overlying skin changes.  Normoactive bowel sounds.  Musculoskeletal:        General: Normal range of motion.     Cervical back: Normal range of motion and neck supple.  Skin:    General: Skin is dry.     Capillary Refill: Capillary refill takes less than 2 seconds.  Neurological:     Mental Status: She is alert and oriented to person, place, and time.     GCS: GCS eye subscore is 4. GCS verbal subscore is 5. GCS motor subscore is 6.  Psychiatric:        Mood and Affect: Mood normal.        Behavior: Behavior normal.        Thought Content: Thought content normal.     ED Results / Procedures / Treatments   Labs (all labs ordered are  listed, but only abnormal results are displayed) Labs Reviewed  COMPREHENSIVE METABOLIC PANEL - Abnormal; Notable for the following components:      Result Value   CO2 20 (*)    Glucose, Bld 116 (*)    All other components within normal limits  LIPASE, BLOOD  CBC  URINALYSIS, ROUTINE W REFLEX MICROSCOPIC  I-STAT BETA  HCG BLOOD, ED ([facility], [facility], [facility] ONLY)    EKG None  Radiology DG Finger Thumb Right  Result Date: 05/11/2019 CLINICAL DATA:  Pain and swelling of the right thumb secondary to a crush injury. Bruising. EXAM: RIGHT THUMB 2+V COMPARISON:  None. FINDINGS: There is no evidence of fracture or dislocation. There is no evidence of arthropathy or other focal bone abnormality. Soft tissues are unremarkable. IMPRESSION: Negative. Electronically Signed   By: Lorriane Shire M.D.   On: 05/11/2019 20:46    Procedures Procedures (including critical care time)  Medications Ordered in ED Medications  sodium chloride flush (NS) 0.9 % injection 3 mL (3 mLs Intravenous Given 05/13/19 1606)  ondansetron (ZOFRAN) injection 4 mg (4 mg Intravenous Given 05/13/19 1614)  sodium chloride 0.9 % bolus 1,000 mL (1,000 mLs Intravenous New Bag/Given 05/13/19 1613)    ED Course  I have reviewed the triage vital signs and the nursing notes.  Pertinent labs & imaging results that were available during my care of the patient were reviewed by me and considered in my medical decision making (see chart for details).    MDM Rules/Calculators/A&P                       Suspect this is likely a viral gastroenteritis.  Low suspicion for any acute or emergent abdominal pathology.  There is no focal TTP on exam.  She endorsed mild, crampy abdominal discomfort that moves around her abdomen.  No hematemesis, hematochezia, or melena.  Given brevity, much lower suspicion for a bacterial colitis.  Patient denies any pelvic discomfort, discharge, vaginal bleeding, or other pelvic/vaginal symptoms.  Offered pelvic exam given  her crampy abdominal discomfort in setting of nausea and vomiting, however patient declined.    On reevaluation, patient feels entirely improved with 1 L IV NS and 4 mg IV Zofran.  Reassessed abdomen and again was entirely nontender to palpation.  Patient's lab work is reassuring and she is denying any pelvic or abdominal discomfort.  Her vital signs are within normal limits and she is hemodynamically stable.  Will prescribe Zofran ODT and Imodium.  Discussed strict ED return precautions with the patient.  All of the evaluation and work-up results were discussed with the patient and any family at bedside. They were provided opportunity to ask any additional questions and have none at this time. They have expressed understanding of verbal discharge instructions as well as return precautions and are agreeable to the plan.   Final Clinical Impression(s) / ED Diagnoses Final diagnoses:  Nausea vomiting and diarrhea    Rx / DC Orders ED Discharge Orders         Ordered    ondansetron (ZOFRAN ODT) 4 MG disintegrating tablet  Every 8 hours PRN     05/13/19 1801    loperamide (IMODIUM) 2 MG capsule  4 times daily PRN     05/13/19 1801           Corena Herter, PA-C 05/13/19 1802    Corena Herter, PA-C 05/13/19 1803    Valarie Merino, MD 05/13/19 2326

## 2019-06-08 ENCOUNTER — Ambulatory Visit: Payer: Self-pay | Admitting: Podiatry

## 2019-12-22 ENCOUNTER — Encounter: Payer: Self-pay | Admitting: Emergency Medicine

## 2019-12-22 ENCOUNTER — Ambulatory Visit
Admission: EM | Admit: 2019-12-22 | Discharge: 2019-12-22 | Disposition: A | Discharge status: Home / Self Care | Payer: Self-pay | Attending: Physician Assistant | Admitting: Physician Assistant

## 2019-12-22 ENCOUNTER — Other Ambulatory Visit: Payer: Self-pay

## 2019-12-22 DIAGNOSIS — M545 Low back pain, unspecified: Secondary | ICD-10-CM

## 2019-12-22 MED ORDER — MELOXICAM 7.5 MG PO TABS: 7.5000 mg | ORAL_TABLET | Freq: Every day | ORAL | 0 refills | Status: DC

## 2019-12-22 MED ORDER — TIZANIDINE HCL 2 MG PO TABS: 2.0000 mg | ORAL_TABLET | Freq: Three times a day (TID) | ORAL | 0 refills | Status: DC | PRN

## 2019-12-22 NOTE — Discharge Instructions (Signed)
Start Mobic. Do not take ibuprofen (motrin/advil)/ naproxen (aleve) while on mobic. Tizanidine as needed, this can make you drowsy, so do not take if you are going to drive, operate heavy machinery, or make important decisions. Ice/heat compresses as needed. This can take up to 3-4 weeks to completely resolve, but you should be feeling better each week. Follow up with PCP/orthopedics if symptoms worsen, changes for reevaluation. If experience numbness/tingling of the inner thighs, loss of bladder or bowel control, go to the emergency department for evaluation.  ° °

## 2019-12-22 NOTE — ED Provider Notes (Signed)
[facility]    CSN: [id number] Arrival date & time: 12/22/19  1856      History   Chief Complaint Chief Complaint  Patient presents with  . Back Pain    HPI Nicole Rose is a 29 y.o. female.   29 year old female comes in for 3 day history of right back pain. Denies injury/trauma, but work requires heavy lifting. Pain is mostly when transitioning position. Denies radiation of pain. Denies numbness/tingling. Denies saddle anesthesia, loss of bladder or bowel control. Denies urinary symptoms. midol without relief.      Past Medical History:  Diagnosis Date  . Anemia    no current med.  . Metacarpal bone fracture 01/2018   left 4th  . Migraines   . Obesity     Patient Active Problem List   Diagnosis Date Noted  . Failed vision screen 10/30/2012  . Failed hearing screening 10/30/2012  . General counseling and advice for contraceptive management 10/30/2012  . Sleep disturbance 10/30/2012  . Dizziness 10/30/2012  . Adjustment disorder with mixed anxiety and depressed mood 10/30/2012  . Smoker 10/23/2012  . 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 Epidermal inclusion cyst 01/10/2012    Past Surgical History:  Procedure Laterality Date  . BREAST BIOPSY Right 09/28/2012   Procedure: EXCISION OF CHRONIC RIGHT BREAST ABSCESS;  Surgeon: Almond Lint, MD;  Location: [facility] OR;  Service: General;  Laterality: Right;  . OPEN REDUCTION INTERNAL FIXATION (ORIF) METACARPAL Left 02/06/2018   Procedure: OPEN REDUCTION INTERNAL FIXATION (ORIF) 4TH METACARPAL;  Surgeon: Sheral Apley, MD;  Location: [facility];  Service: Orthopedics;  Laterality: Left;    OB History   No obstetric history on file.      Home Medications    Prior to Admission medications   Medication Sig Start Date End Date Taking? Authorizing Provider  meloxicam (MOBIC) 7.5 MG tablet Take 1 tablet (7.5 mg total) by mouth daily. 12/22/19   Cathie Hoops, Zyren Sevigny V, PA-C  tiZANidine (ZANAFLEX) 2  MG tablet Take 1 tablet (2 mg total) by mouth every 8 (eight) hours as needed for muscle spasms. 12/22/19   Cathie Hoops, Keshawna Dix V, PA-C  cetirizine (ZYRTEC) 10 MG tablet Take 1 tablet (10 mg total) by mouth daily. 03/16/18 12/22/19  Dahlia Byes A, NP  fluticasone (FLONASE) 50 MCG/ACT nasal spray Place 1-2 sprays into both nostrils daily for 7 days. 05/18/18 12/22/19  Wieters, Junius Creamer, PA-C    Family History Family History  Problem Relation Age of Onset  . Cancer Maternal Aunt        breast  . Cancer Paternal Aunt        lung  . Cancer Maternal Aunt        breast  . Cancer Maternal Aunt        ovarian  . Cancer Maternal Aunt        ovarian    Social History Social History   Tobacco Use  . Smoking status: Current Some Day Smoker    Packs/day: 0.00    Years: 7.00    Pack years: 0.00    Types: Cigarettes  . Smokeless tobacco: Never Used  . Tobacco comment: 6 cig./day  Vaping Use  . Vaping Use: Never used  Substance Use Topics  . Alcohol use: Yes    Comment: occasionally  . Drug use: No     Allergies   Latex   Review of Systems Review of Systems  Reason unable to perform ROS:  See HPI as above.     Physical Exam Triage Vital Signs ED Triage Vitals  Enc Vitals Group     BP 12/22/19 1937 (!) 100/58     Pulse Rate 12/22/19 1937 76     Resp 12/22/19 1937 16     Temp 12/22/19 1937 98.6 F (37 C)     Temp Source 12/22/19 1937 Oral     SpO2 12/22/19 1937 98 %     Weight --      Height --      Head Circumference --      Peak Flow --      Pain Score 12/22/19 1935 8     Pain Loc --      Pain Edu? --      Excl. in GC? --    No data found.  Updated Vital Signs BP (!) 100/58   Pulse 76   Temp 98.6 F (37 C) (Oral)   Resp 16   LMP 12/08/2019   SpO2 98%   Physical Exam Constitutional:      General: She is not in acute distress.    Appearance: She is well-developed. She is not diaphoretic.  HENT:     Head: Normocephalic and atraumatic.  Eyes:     Conjunctiva/sclera:  Conjunctivae normal.     Pupils: Pupils are equal, round, and reactive to light.  Cardiovascular:     Rate and Rhythm: Normal rate and regular rhythm.     Heart sounds: Normal heart sounds. No murmur heard.  No friction rub. No gallop.   Pulmonary:     Effort: Pulmonary effort is normal. No accessory muscle usage or respiratory distress.     Breath sounds: Normal breath sounds. No stridor. No decreased breath sounds, wheezing, rhonchi or rales.  Musculoskeletal:     Comments: Diffuse tenderness to palpation of right low thoracic back as well as lumbar back. No tenderness to hips. Decreased ROM of back, full ROM of hip. Strength/sensation intact distally. Negative straight leg raise.   Skin:    General: Skin is warm and dry.  Neurological:     Mental Status: She is alert and oriented to person, place, and time.      UC Treatments / Results  Labs (all labs ordered are listed, but only abnormal results are displayed) Labs Reviewed - No data to display  EKG   Radiology No results found.  Procedures Procedures (including critical care time)  Medications Ordered in UC Medications - No data to display  Initial Impression / Assessment and Plan / UC Course  I have reviewed the triage vital signs and the nursing notes.  Pertinent labs & imaging results that were available during my care of the patient were reviewed by me and considered in my medical decision making (see chart for details).    NSAID as directed. Muscle relaxant as needed. Ice/heat compresses. Expected course of healing discussed. Return precautions given.   Final Clinical Impressions(s) / UC Diagnoses   Final diagnoses:  Acute right-sided low back pain without sciatica    ED Prescriptions    Medication Sig Dispense Auth. Provider   meloxicam (MOBIC) 7.5 MG tablet Take 1 tablet (7.5 mg total) by mouth daily. 10 tablet Sony Schlarb V, PA-C   tiZANidine (ZANAFLEX) 2 MG tablet Take 1 tablet (2 mg total) by mouth  every 8 (eight) hours as needed for muscle spasms. 15 tablet Belinda Fisher, PA-C     PDMP not reviewed this encounter.  Belinda Fisher, PA-C 12/22/19 2040

## 2019-12-22 NOTE — ED Triage Notes (Signed)
PT reports mid back pain that is worse on right side. Worsens with movement. Denies urinary symptoms. Pushes heavy totes at work. Pain started 3 days ago when she got home from work.

## 2020-01-14 ENCOUNTER — Other Ambulatory Visit: Payer: Self-pay

## 2020-01-14 ENCOUNTER — Ambulatory Visit (HOSPITAL_COMMUNITY)
Admission: EM | Admit: 2020-01-14 | Discharge: 2020-01-14 | Disposition: A | Discharge status: Home / Self Care | Payer: Medicaid Other | Attending: Family Medicine | Admitting: Family Medicine

## 2020-01-14 ENCOUNTER — Encounter (HOSPITAL_COMMUNITY): Payer: Self-pay

## 2020-01-14 DIAGNOSIS — K029 Dental caries, unspecified: Secondary | ICD-10-CM

## 2020-01-14 DIAGNOSIS — K0889 Other specified disorders of teeth and supporting structures: Secondary | ICD-10-CM

## 2020-01-14 MED ORDER — KETOROLAC TROMETHAMINE 60 MG/2ML IM SOLN
INTRAMUSCULAR | Status: AC
  Filled 2020-01-14: qty 2

## 2020-01-14 MED ORDER — AMOXICILLIN-POT CLAVULANATE 875-125 MG PO TABS: 1.0000 | ORAL_TABLET | Freq: Two times a day (BID) | ORAL | 0 refills | Status: DC

## 2020-01-14 MED ORDER — HYDROCODONE-ACETAMINOPHEN 7.5-325 MG PO TABS: 1.0000 | ORAL_TABLET | Freq: Four times a day (QID) | ORAL | 0 refills | Status: DC | PRN

## 2020-01-14 MED ORDER — IBUPROFEN 800 MG PO TABS: 800.0000 mg | ORAL_TABLET | Freq: Three times a day (TID) | ORAL | 0 refills | Status: DC

## 2020-01-14 MED ORDER — KETOROLAC TROMETHAMINE 60 MG/2ML IM SOLN
60.0000 mg | Freq: Once | INTRAMUSCULAR | Status: AC
  Administered 2020-01-14: 60 mg via INTRAMUSCULAR

## 2020-01-14 NOTE — ED Provider Notes (Signed)
[facility]    CSN: [id number] Arrival date & time: 01/14/20  7169      History   Chief Complaint Chief Complaint  Patient presents with  . Dental Pain    x 3 days    HPI Nicole Rose is a 29 y.o. female.   HPI   Patient has multiple caries and fractures in her teeth.  Since yesterday she has had severe pain.  She states she started with some irritation 2 days ago.  It is in the right upper side of her teeth.  The gums are swollen.  She states her mouth tastes bad.  It hurts about she can hardly talk or eat.  Past Medical History:  Diagnosis Date  . Anemia    no current med.  . Metacarpal bone fracture 01/2018   left 4th  . Migraines   . Obesity     Patient Active Problem List   Diagnosis Date Noted  . Failed vision screen 10/30/2012  . Failed hearing screening 10/30/2012  . General counseling and advice for contraceptive management 10/30/2012  . Sleep disturbance 10/30/2012  . Dizziness 10/30/2012  . Adjustment disorder with mixed anxiety and depressed mood 10/30/2012  . Smoker 10/23/2012  . 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 Epidermal inclusion cyst 01/10/2012    Past Surgical History:  Procedure Laterality Date  . BREAST BIOPSY Right 09/28/2012   Procedure: EXCISION OF CHRONIC RIGHT BREAST ABSCESS;  Surgeon: Almond Lint, MD;  Location: [facility];  Service: General;  Laterality: Right;  . OPEN REDUCTION INTERNAL FIXATION (ORIF) METACARPAL Left 02/06/2018   Procedure: OPEN REDUCTION INTERNAL FIXATION (ORIF) 4TH METACARPAL;  Surgeon: Sheral Apley, MD;  Location: [facility];  Service: Orthopedics;  Laterality: Left;    OB History   No obstetric history on file.      Home Medications    Prior to Admission medications   Medication Sig Start Date End Date Taking? Authorizing Provider  amoxicillin-clavulanate (AUGMENTIN) 875-125 MG tablet Take 1 tablet by mouth every 12 (twelve) hours. 01/14/20   Eustace Moore, MD    HYDROcodone-acetaminophen (NORCO) 7.5-325 MG tablet Take 1 tablet by mouth every 6 (six) hours as needed for moderate pain. 01/14/20   Eustace Moore, MD  ibuprofen (ADVIL) 800 MG tablet Take 1 tablet (800 mg total) by mouth 3 (three) times daily. 01/14/20   Eustace Moore, MD  cetirizine (ZYRTEC) 10 MG tablet Take 1 tablet (10 mg total) by mouth daily. 03/16/18 12/22/19  Dahlia Byes A, NP  fluticasone (FLONASE) 50 MCG/ACT nasal spray Place 1-2 sprays into both nostrils daily for 7 days. 05/18/18 12/22/19  Wieters, Junius Creamer, PA-C    Family History Family History  Problem Relation Age of Onset  . Cancer Maternal Aunt        breast  . Cancer Paternal Aunt        lung  . Cancer Maternal Aunt        breast  . Cancer Maternal Aunt        ovarian  . Cancer Maternal Aunt        ovarian    Social History Social History   Tobacco Use  . Smoking status: Current Some Day Smoker    Packs/day: 0.00    Years: 7.00    Pack years: 0.00    Types: Cigarettes  . Smokeless tobacco: Never Used  . Tobacco comment: 6 cig./day  Vaping Use  . Vaping Use: Never used  Substance Use Topics  . Alcohol use: Yes    Comment: occasionally  . Drug use: No     Allergies   Latex   Review of Systems Review of Systems See HPI  Physical Exam Triage Vital Signs ED Triage Vitals  Enc Vitals Group     BP 01/14/20 0919 127/74     Pulse Rate 01/14/20 0919 61     Resp 01/14/20 0919 18     Temp 01/14/20 0919 98.1 F (36.7 C)     Temp Source 01/14/20 0919 Oral     SpO2 01/14/20 0919 100 %     Weight --      Height --      Head Circumference --      Peak Flow --      Pain Score 01/14/20 0920 10     Pain Loc --      Pain Edu? --      Excl. in GC? --    No data found.  Updated Vital Signs BP 127/74 (BP Location: Right Arm)   Pulse 61   Temp 98.1 F (36.7 C) (Oral)   Resp 18   LMP 12/28/2019 (Exact Date)   SpO2 100%     Physical Exam Constitutional:      General: She is not in  acute distress.    Appearance: She is well-developed.  HENT:     Head: Normocephalic and atraumatic.     Mouth/Throat:   Eyes:     Conjunctiva/sclera: Conjunctivae normal.     Pupils: Pupils are equal, round, and reactive to light.  Cardiovascular:     Rate and Rhythm: Normal rate.  Pulmonary:     Effort: Pulmonary effort is normal. No respiratory distress.  Abdominal:     General: There is no distension.     Palpations: Abdomen is soft.  Musculoskeletal:        General: Normal range of motion.     Cervical back: Normal range of motion.  Lymphadenopathy:     Cervical: No cervical adenopathy.  Skin:    General: Skin is warm and dry.  Neurological:     Mental Status: She is alert.  Psychiatric:        Behavior: Behavior normal.      UC Treatments / Results  Labs (all labs ordered are listed, but only abnormal results are displayed) Labs Reviewed - No data to display  EKG   Radiology No results found.  Procedures Procedures (including critical care time)  Medications Ordered in UC Medications  ketorolac (TORADOL) injection 60 mg (60 mg Intramuscular Given 01/14/20 0951)    Initial Impression / Assessment and Plan / UC Course  I have reviewed the triage vital signs and the nursing notes.  Pertinent labs & imaging results that were available during my care of the patient were reviewed by me and considered in my medical decision making (see chart for details).     Patient appears very uncomfortable in pain.  Holding her face and rocking back and forth on the table.  She is given a shot of Toradol initially.  Antibiotics and pain medicine follow-up. Final Clinical Impressions(s) / UC Diagnoses   Final diagnoses:  Dental caries  Pain, dental     Discharge Instructions     Take the antibiotic 2 times a day Take ibuprofen 3 times a day with food If needed, in addition, take hydrocodone for pain.  This can cause drowsiness.  Do not drive on hydrocodone Call  today to the dentist to see if you can get an appointment for next week   ED Prescriptions    Medication Sig Dispense Auth. Provider   amoxicillin-clavulanate (AUGMENTIN) 875-125 MG tablet Take 1 tablet by mouth every 12 (twelve) hours. 14 tablet Eustace Moore, MD   ibuprofen (ADVIL) 800 MG tablet Take 1 tablet (800 mg total) by mouth 3 (three) times daily. 21 tablet Eustace Moore, MD   HYDROcodone-acetaminophen [facility]) 7.5-325 MG tablet Take 1 tablet by mouth every 6 (six) hours as needed for moderate pain. 15 tablet Eustace Moore, MD     I have reviewed the PDMP during this encounter.   Eustace Moore, MD 01/14/20 [phone]

## 2020-01-14 NOTE — Discharge Instructions (Addendum)
Take the antibiotic 2 times a day Take ibuprofen 3 times a day with food If needed, in addition, take hydrocodone for pain.  This can cause drowsiness.  Do not drive on hydrocodone Call today to the dentist to see if you can get an appointment for next week

## 2020-01-14 NOTE — ED Triage Notes (Signed)
Patient states she has been having dental pain on her upper right side x 3 days. Pt states she has tried otc meds with no relief. Pt is aox4 and ambulatory.

## 2020-09-03 ENCOUNTER — Emergency Department (HOSPITAL_COMMUNITY): Payer: Managed Care, Other (non HMO)

## 2020-09-03 ENCOUNTER — Emergency Department (HOSPITAL_COMMUNITY)
Admission: EM | Admit: 2020-09-03 | Discharge: 2020-09-03 | Disposition: A | Discharge status: Home / Self Care | Payer: Managed Care, Other (non HMO) | Attending: Emergency Medicine | Admitting: Emergency Medicine

## 2020-09-03 ENCOUNTER — Encounter (HOSPITAL_COMMUNITY): Payer: Self-pay

## 2020-09-03 ENCOUNTER — Ambulatory Visit (HOSPITAL_COMMUNITY)
Admission: EM | Admit: 2020-09-03 | Discharge: 2020-09-03 | Disposition: A | Discharge status: Home / Self Care | Payer: Managed Care, Other (non HMO) | Attending: Physician Assistant | Admitting: Physician Assistant

## 2020-09-03 ENCOUNTER — Other Ambulatory Visit: Payer: Self-pay

## 2020-09-03 DIAGNOSIS — Z91040 Latex allergy status: Secondary | ICD-10-CM | POA: Diagnosis not present

## 2020-09-03 DIAGNOSIS — R1084 Generalized abdominal pain: Secondary | ICD-10-CM

## 2020-09-03 DIAGNOSIS — R1030 Lower abdominal pain, unspecified: Secondary | ICD-10-CM | POA: Diagnosis not present

## 2020-09-03 DIAGNOSIS — F17210 Nicotine dependence, cigarettes, uncomplicated: Secondary | ICD-10-CM | POA: Insufficient documentation

## 2020-09-03 DIAGNOSIS — A5901 Trichomonal vulvovaginitis: Secondary | ICD-10-CM | POA: Insufficient documentation

## 2020-09-03 DIAGNOSIS — R1031 Right lower quadrant pain: Secondary | ICD-10-CM | POA: Diagnosis present

## 2020-09-03 DIAGNOSIS — N739 Female pelvic inflammatory disease, unspecified: Secondary | ICD-10-CM

## 2020-09-03 LAB — WET PREP, GENITAL
Sperm: NONE SEEN
Yeast Wet Prep HPF POC: NONE SEEN

## 2020-09-03 LAB — POCT URINALYSIS DIPSTICK, ED / UC
Bilirubin Urine: NEGATIVE
Glucose, UA: NEGATIVE mg/dL
Hgb urine dipstick: NEGATIVE
Ketones, ur: NEGATIVE mg/dL
Leukocytes,Ua: NEGATIVE
Nitrite: NEGATIVE
Protein, ur: NEGATIVE mg/dL
Specific Gravity, Urine: 1.02 (ref 1.005–1.030)
Urobilinogen, UA: 0.2 mg/dL (ref 0.0–1.0)
pH: 7 (ref 5.0–8.0)

## 2020-09-03 LAB — COMPREHENSIVE METABOLIC PANEL
ALT: 15 U/L (ref 0–44)
AST: 20 U/L (ref 15–41)
Albumin: 3.7 g/dL (ref 3.5–5.0)
Alkaline Phosphatase: 55 U/L (ref 38–126)
Anion gap: 6 (ref 5–15)
BUN: 9 mg/dL (ref 6–20)
CO2: 25 mmol/L (ref 22–32)
Calcium: 8.9 mg/dL (ref 8.9–10.3)
Chloride: 108 mmol/L (ref 98–111)
Creatinine, Ser: 0.64 mg/dL (ref 0.44–1.00)
GFR, Estimated: >60 mL/min (ref >60)
Glucose, Bld: 79 mg/dL (ref 70–99)
Potassium: 3.9 mmol/L (ref 3.5–5.1)
Sodium: 139 mmol/L (ref 135–145)
Total Bilirubin: 0.6 mg/dL (ref 0.3–1.2)
Total Protein: 7 g/dL (ref 6.5–8.1)

## 2020-09-03 LAB — CBC
HCT: 40.5 % (ref 36.0–46.0)
Hemoglobin: 12.7 g/dL (ref 12.0–15.0)
MCH: 29.8 pg (ref 26.0–34.0)
MCHC: 31.4 g/dL (ref 30.0–36.0)
MCV: 95.1 fL (ref 80.0–100.0)
Platelets: 250 10*3/uL (ref 150–400)
RBC: 4.26 MIL/uL (ref 3.87–5.11)
RDW: 14.8 % (ref 11.5–15.5)
WBC: 14 10*3/uL — ABNORMAL HIGH (ref 4.0–10.5)
nRBC: 0 % (ref 0.0–0.2)

## 2020-09-03 LAB — LIPASE, BLOOD: Lipase: 23 U/L (ref 11–51)

## 2020-09-03 LAB — POC URINE PREG, ED: Preg Test, Ur: NEGATIVE

## 2020-09-03 MED ORDER — IOHEXOL 350 MG/ML SOLN
80.0000 mL | Freq: Once | INTRAVENOUS | Status: AC | PRN
  Administered 2020-09-03: 80 mL via INTRAVENOUS

## 2020-09-03 MED ORDER — LIDOCAINE HCL (PF) 1 % IJ SOLN
1.0000 mL | Freq: Once | INTRAMUSCULAR | Status: AC
  Administered 2020-09-03: 1 mL
  Filled 2020-09-03: qty 30

## 2020-09-03 MED ORDER — METRONIDAZOLE 500 MG PO TABS
500.0000 mg | ORAL_TABLET | Freq: Two times a day (BID) | ORAL | 0 refills | Status: DC
Start: 2020-09-03 — End: 2021-08-09

## 2020-09-03 MED ORDER — AZITHROMYCIN 250 MG PO TABS
1000.0000 mg | ORAL_TABLET | Freq: Once | ORAL | Status: AC
  Administered 2020-09-03: 1000 mg via ORAL
  Filled 2020-09-03: qty 4

## 2020-09-03 MED ORDER — CEFTRIAXONE SODIUM 1 G IJ SOLR
500.0000 mg | Freq: Once | INTRAMUSCULAR | Status: AC
  Administered 2020-09-03: 500 mg via INTRAMUSCULAR
  Filled 2020-09-03: qty 10

## 2020-09-03 MED ORDER — SODIUM CHLORIDE (PF) 0.9 % IJ SOLN
INTRAMUSCULAR | Status: AC
  Filled 2020-09-03: qty 50

## 2020-09-03 NOTE — Discharge Instructions (Addendum)
Your evaluation today is consistent with pelvic infection.  You have been treated here in the emergency department with Rocephin and Zithromax to cover some forms of infection.  Additionally, your trichomonas noted on your wet prep.  You are being treated with metronidazole to cover this. Please drink plenty of fluids.  You have a mass noted in your uterus that is likely to be a fibroid.  Please follow-up with your gynecologist for recheck and definitive diagnosis. Return if you are having worsening symptoms of pain, fever, or unable to tolerate your medicines.

## 2020-09-03 NOTE — ED Notes (Signed)
Patient is being discharged from the Urgent Care and sent to the Emergency Department via POV . Per Dorann Ou PA, patient is in need of higher level of care due to abdominal pain . Patient is aware and verbalizes understanding of plan of care.  Vitals:   09/03/20 1346  BP: 107/61  Pulse: 85  Resp: 20  Temp: 99.6 F (37.6 C)  SpO2: 99%

## 2020-09-03 NOTE — ED Provider Notes (Signed)
[facility]-EMERGENCY DEPT Provider Note   CSN: [id number] Arrival date & time: 09/03/20  1429     History Chief Complaint  Patient presents with   Abdominal Pain    Nicole Rose is a 30 y.o. female.  HPI 30 year old female G1, P0 A1 presents today complaining of diffuse abdominal pain.  She states that this began about 5 days ago in the upper abdomen.  It is now spread throughout her bilateral sides and into the lower abdomen.  It is cramping in nature.  She denies any nausea, vomiting, diarrhea, fever, chills, UTI symptoms.  She was seen at urgent care prior to evaluation here and had a negative pregnancy test.  Her last menstrual period was normal at the end of June.     Past Medical History:  Diagnosis Date   Anemia    no current med.   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     Family History  Problem Relation Age of Onset   Cancer Maternal Aunt        breast   Cancer  Paternal Aunt        lung   Cancer Maternal Aunt        breast   Cancer Maternal Aunt        ovarian   Cancer Maternal Aunt        ovarian    Social History   Tobacco Use   Smoking status: Some Days    Packs/day: 0.00    Years: 7.00    Pack years: 0.00    Types: Cigarettes   Smokeless tobacco: Never   Tobacco comments:    6 cig./day  Vaping Use   Vaping Use: Never used  Substance Use Topics   Alcohol use: Yes    Comment: occasionally   Drug use: No    Home Medications Prior to Admission medications   Medication Sig Start Date End Date Taking? Authorizing Provider  amoxicillin-clavulanate (AUGMENTIN) 875-125 MG tablet Take 1 tablet by mouth every 12 (twelve) hours. Patient not taking: No sig reported 01/14/20   Eustace Moore, MD  HYDROcodone-acetaminophen [facility]) 7.5-325 MG tablet Take 1 tablet by mouth every 6 (six) hours as needed for moderate pain. Patient not taking: No sig reported 01/14/20   Eustace Moore, MD  ibuprofen (ADVIL) 800 MG tablet Take 1 tablet (800 mg total) by mouth 3 (three) times daily. Patient not taking: No sig reported 01/14/20   Eustace Moore, MD  cetirizine (ZYRTEC) 10 MG tablet Take 1 tablet (10 mg total) by mouth daily. 03/16/18 12/22/19  Dahlia Byes A, NP  fluticasone (FLONASE) 50 MCG/ACT nasal spray Place 1-2 sprays into both nostrils daily for 7 days. 05/18/18 12/22/19  Wieters, Hallie C, PA-C    Allergies    Latex  Review of Systems   Review of Systems  All other systems reviewed and are negative.  Physical Exam Updated Vital Signs BP 119/70 (BP Location: Left Arm)   Pulse 67   Temp 98.3 F (36.8 C) (Oral)   Resp 18   Ht 1.702 m (5\' 7" )   Wt 73.9 kg   LMP 08/16/2020 (Exact Date)   SpO2 100%   BMI 25.53 kg/m   Physical Exam Vitals and nursing note reviewed.  Constitutional:      Appearance: She is well-developed.  HENT:     Head: Normocephalic.  Eyes:     Extraocular Movements: Extraocular movements intact.   Cardiovascular:     Rate and Rhythm: Normal rate and regular rhythm.  Pulmonary:     Effort: Pulmonary effort is normal.     Breath sounds: Normal breath sounds.  Abdominal:     General: Abdomen is flat. Bowel sounds are normal. There is no distension.     Palpations: Abdomen is soft.     Tenderness: There is generalized abdominal tenderness.  Genitourinary:    Vagina: Vaginal discharge present.     Cervix: Cervical motion tenderness and discharge present.     Uterus: Tender. Not enlarged.      Adnexa: Right adnexa normal and left adnexa normal.  Skin:    General: Skin is warm and dry.     Capillary Refill: Capillary refill takes less than 2 seconds.  Neurological:     General: No focal deficit present.     Mental Status: She is alert.  Psychiatric:        Mood and Affect: Mood normal.    ED Results / Procedures / Treatments   Labs (all labs ordered are listed, but only abnormal results are displayed) Labs Reviewed  CBC  COMPREHENSIVE METABOLIC PANEL  LIPASE, BLOOD  URINALYSIS, ROUTINE W REFLEX MICROSCOPIC    EKG None  Radiology CT ABDOMEN PELVIS W CONTRAST  Result Date: 09/03/2020 CLINICAL DATA:  Abdominal pain. EXAM: CT ABDOMEN AND PELVIS WITH CONTRAST TECHNIQUE: Multidetector CT imaging of the abdomen and pelvis was performed using the standard protocol following bolus administration of intravenous contrast. CONTRAST:  33mL OMNIPAQUE IOHEXOL 350 MG/ML SOLN COMPARISON:  None. FINDINGS: Lower chest: No acute abnormality. Hepatobiliary: No focal liver abnormality is seen. No gallstones, gallbladder wall thickening, or biliary dilatation. Pancreas: Unremarkable. No pancreatic ductal dilatation or surrounding inflammatory changes. Spleen: Normal in size without focal abnormality. Adrenals/Urinary Tract: Adrenal glands are unremarkable. Kidneys are normal, without renal calculi, focal lesion, or hydronephrosis. Bladder is unremarkable. Stomach/Bowel: Stomach is within normal  limits. Appendix appears normal. No evidence of bowel wall thickening, distention, or inflammatory changes. Vascular/Lymphatic: No significant vascular findings are present. No enlarged abdominal or pelvic lymph nodes. Reproductive: Intramural myometrial mass in the ventral uterine body measures 1.9 cm. Normal appearance of the ovaries. Other: No abdominal wall hernia or abnormality. No abdominopelvic ascites. Musculoskeletal: No acute or significant osseous findings. IMPRESSION: 1. No evidence of acute abnormalities within the abdomen or pelvis. 2. 1.9 cm intramural myometrial mass in the ventral uterine body, likely representing a fibroid. Given the patient's young age correlation with pelvic ultrasound may be considered. Electronically Signed  By: Ted Mcalpine M.D.   On: 09/03/2020 16:21    Procedures Procedures   Medications Ordered in ED Medications - No data to display  ED Course  I have reviewed the triage vital signs and the nursing notes.  Pertinent labs & imaging results that were available during my care of the patient were reviewed by me and considered in my medical decision making (see chart for details).    MDM Rules/Calculators/A&P                          30 year old female presents today with 1 week of abdominal pain.  On evaluation she had diffuse abdominal tenderness.  CT was obtained and shows no evidence of definitive acute intra-abdominal abnormality.  However, on physical exam, patient has significant vaginal discharge and pain with cervical movement.  This is consistent with infection/PID.  She has trichomoniasis and clue cells present on wet prep.  She is given Rocephin and Zithromax here in the ED to cover for gonorrhea and chlamydia. Final Clinical Impression(s) / ED Diagnoses Final diagnoses:  Generalized abdominal pain  Pelvic infection in female  Trichomonal vulvovaginitis    Rx / DC Orders ED Discharge Orders     None        Margarita Grizzle,  MD 09/03/20 1700

## 2020-09-03 NOTE — ED Triage Notes (Signed)
Pt c/o abdominal pain x 5 days.  Sts pain started in upper abdomen but is now in lower.  Pain score 6/10.  Pt has not taken anything for pain.  Pt was seen at [facility] and directed to come to the ED for a CT.  Pt reports standing partially alleviates pain.  Denies n/v/d and dysuria.   UC took a urine sample, but no bloodwork.

## 2020-09-03 NOTE — ED Triage Notes (Signed)
Pt presents with stomach pain x 5 days.   States she works where a lot of physical movement is required.

## 2020-09-03 NOTE — Discharge Instructions (Addendum)
As we discussed, I think the safe thing to do is to go to the emergency room for CT scan given your abdominal tenderness to rule out any serious conditions.

## 2020-09-03 NOTE — ED Provider Notes (Signed)
[facility]    CSN: [id number] Arrival date & time: 09/03/20  1230      History   Chief Complaint Chief Complaint  Patient presents with   Abdominal Pain    Stomach     HPI Nicole Rose is a 30 y.o. female.   Patient presents today with a 5-day history of abdominal pain.  Reports that symptoms began in epigastrium have spread to involve entire abdomen but worse in lower areas.  Pain is rated at 6-7 on a 0-10 pain scale, localized to lower abdomen without radiation, described as cramping, no aggravating or alleviating factors identified.  She has not tried any over-the-counter medications for symptom management.  Denies any fever, nausea, vomiting, melena, hematochezia, changes in bowel habits, obstipation.  She denies previous abdominal surgeries.  She does not believe that she is pregnant but is open to testing.  She denies history of gastrointestinal disorder.  She reports symptoms are gradually been worsening prompting evaluation today.  She does report some urinary frequency and urgency but denies any dysuria or flank pain.  She denies any recent urogenital procedure or self-catheterization.  Denies any recent antibiotic use.  Denies history of nephrolithiasis.   Past Medical History:  Diagnosis Date   Anemia    no current med.   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      Home Medications    Prior to Admission medications   Medication Sig Start Date End Date Taking? Authorizing Provider  amoxicillin-clavulanate (AUGMENTIN) 875-125 MG tablet Take 1 tablet by mouth every 12 (twelve) hours. 01/14/20   Eustace Moore, MD  HYDROcodone-acetaminophen (NORCO) 7.5-325 MG tablet Take 1 tablet by mouth every 6 (six) hours as needed for moderate pain. 01/14/20   Eustace Moore, MD  ibuprofen (ADVIL) 800 MG tablet Take 1 tablet (800 mg total) by mouth 3 (three) times daily. 01/14/20   Eustace Moore, MD  cetirizine (ZYRTEC) 10 MG tablet Take 1 tablet (10 mg total) by mouth daily. 03/16/18 12/22/19  Dahlia Byes A, NP  fluticasone (FLONASE) 50 MCG/ACT nasal spray Place 1-2 sprays into both nostrils daily for 7 days. 05/18/18 12/22/19  Wieters, Junius Creamer, PA-C    Family History Family History  Problem Relation Age of Onset   Cancer Maternal Aunt        breast   Cancer Paternal Aunt        lung   Cancer Maternal Aunt        breast  Cancer Maternal Aunt        ovarian   Cancer Maternal Aunt        ovarian    Social History Social History   Tobacco Use   Smoking status: Some Days    Packs/day: 0.00    Years: 7.00    Pack years: 0.00    Types: Cigarettes   Smokeless tobacco: Never   Tobacco comments:    6 cig./day  Vaping Use   Vaping Use: Never used  Substance Use Topics   Alcohol use: Yes    Comment: occasionally   Drug use: No     Allergies   Latex   Review of Systems Review of Systems  Constitutional:  Positive for appetite change. Negative for  activity change, fatigue and fever.  Respiratory:  Negative for cough and shortness of breath.   Cardiovascular:  Negative for chest pain.  Gastrointestinal:  Positive for abdominal pain. Negative for constipation, diarrhea, nausea and vomiting.  Genitourinary:  Positive for frequency and urgency. Negative for dysuria, flank pain, pelvic pain, vaginal bleeding, vaginal discharge and vaginal pain.    Physical Exam Triage Vital Signs ED Triage Vitals  Enc Vitals Group     BP 09/03/20 1346 107/61     Pulse Rate 09/03/20 1346 85     Resp 09/03/20 1346 20     Temp 09/03/20 1346 99.6 F (37.6 C)     Temp Source 09/03/20 1346 Oral     SpO2 09/03/20 1346 99 %     Weight --      Height --      Head Circumference --      Peak Flow --      Pain Score 09/03/20 1345 6     Pain Loc --      Pain Edu? --      Excl. in GC? --    No data found.  Updated Vital Signs BP 107/61 (BP Location: Left Arm)   Pulse 85   Temp 99.6 F (37.6 C) (Oral)   Resp 20   LMP 08/16/2020 (Exact Date)   SpO2 99%   Visual Acuity Right Eye Distance:   Left Eye Distance:   Bilateral Distance:    Right Eye Near:   Left Eye Near:    Bilateral Near:     Physical Exam Vitals reviewed.  Constitutional:      General: She is awake. She is not in acute distress.    Appearance: Normal appearance. She is normal weight. She is not ill-appearing.     Comments: Very pleasant female appears stated age in no acute distress sitting comfortably in exam room  HENT:     Head: Normocephalic and atraumatic.  Cardiovascular:     Rate and Rhythm: Normal rate and regular rhythm.     Heart sounds: Normal heart sounds, S1 normal and S2 normal. No murmur heard. Pulmonary:     Effort: Pulmonary effort is normal.     Breath sounds: Normal breath sounds. No wheezing, rhonchi or rales.     Comments: Clear to auscultation bilaterally Abdominal:     General: Bowel sounds are normal.     Palpations: Abdomen is soft.      Tenderness: There is generalized abdominal tenderness and tenderness in the right lower quadrant, suprapubic area and left lower quadrant. There is guarding. There is no right CVA tenderness, left CVA tenderness or rebound.     Comments: Tenderness palpation throughout abdomen.   Psychiatric:  Behavior: Behavior is cooperative.     UC Treatments / Results  Labs (all labs ordered are listed, but only abnormal results are displayed) Labs Reviewed  POCT URINALYSIS DIPSTICK, ED / UC  POC URINE PREG, ED    EKG   Radiology No results found.  Procedures Procedures (including critical care time)  Medications Ordered in UC Medications - No data to display  Initial Impression / Assessment and Plan / UC Course  I have reviewed the triage vital signs and the nursing notes.  Pertinent labs & imaging results that were available during my care of the patient were reviewed by me and considered in my medical decision making (see chart for details).      Urine pregnancy test was negative in clinic today.  UA was normal with no evidence of infection or other abnormalities.  Given widespread tenderness with guarding on exam discussed with CV team to do would be to go to the emergency room.  Discussed that we are unable to obtain CT imaging to rule out intra-abdominal pathology.  Patient was agreeable and will go to [facility] emergency room following visit today for further evaluation.  Vital signs are stable at time of discharge and she was safe for private transport.  Final Clinical Impressions(s) / UC Diagnoses   Final diagnoses:  Generalized abdominal pain  Lower abdominal pain     Discharge Instructions      As we discussed, I think the safe thing to do is to go to the emergency room for CT scan given your abdominal tenderness to rule out any serious conditions.       ED Prescriptions   None    PDMP not reviewed this encounter.   Jeani Hawking, PA-C 09/03/20  1419

## 2020-09-04 LAB — GC/CHLAMYDIA PROBE AMP (~~LOC~~) NOT AT ARMC
Chlamydia: NEGATIVE
Comment: NEGATIVE
Comment: NORMAL
Neisseria Gonorrhea: POSITIVE — AB

## 2021-02-16 ENCOUNTER — Emergency Department (HOSPITAL_COMMUNITY)
Admission: EM | Admit: 2021-02-16 | Discharge: 2021-02-17 | Discharge status: Against Medical Advice | Payer: No Typology Code available for payment source | Attending: Emergency Medicine | Admitting: Emergency Medicine

## 2021-02-16 ENCOUNTER — Encounter (HOSPITAL_COMMUNITY): Payer: Self-pay | Admitting: Emergency Medicine

## 2021-02-16 ENCOUNTER — Emergency Department (HOSPITAL_COMMUNITY): Payer: No Typology Code available for payment source

## 2021-02-16 DIAGNOSIS — Y990 Civilian activity done for income or pay: Secondary | ICD-10-CM | POA: Diagnosis not present

## 2021-02-16 DIAGNOSIS — F17210 Nicotine dependence, cigarettes, uncomplicated: Secondary | ICD-10-CM | POA: Diagnosis not present

## 2021-02-16 DIAGNOSIS — W228XXA Striking against or struck by other objects, initial encounter: Secondary | ICD-10-CM | POA: Insufficient documentation

## 2021-02-16 DIAGNOSIS — M542 Cervicalgia: Secondary | ICD-10-CM | POA: Diagnosis not present

## 2021-02-16 DIAGNOSIS — Z79899 Other long term (current) drug therapy: Secondary | ICD-10-CM | POA: Insufficient documentation

## 2021-02-16 DIAGNOSIS — Z91040 Latex allergy status: Secondary | ICD-10-CM | POA: Insufficient documentation

## 2021-02-16 DIAGNOSIS — S0990XA Unspecified injury of head, initial encounter: Secondary | ICD-10-CM | POA: Diagnosis present

## 2021-02-16 DIAGNOSIS — Z5321 Procedure and treatment not carried out due to patient leaving prior to being seen by health care provider: Secondary | ICD-10-CM | POA: Insufficient documentation

## 2021-02-16 HISTORY — DX: Unspecified convulsions: R56.9

## 2021-02-16 NOTE — ED Notes (Signed)
Pt not responding doer vital

## 2021-02-16 NOTE — ED Provider Notes (Signed)
Emergency Medicine Provider Triage Evaluation Note  Nicole Rose , a 30 y.o. female  was evaluated in triage.  Pt complains of head injury that occurred at work.  Patient does not really recall the incident although she was hit in the head and woke up on the floor.  No nausea, vomiting, seizure-like activity, incontinence.  Patient currently states she is fatigued and has a severe headache.  Review of Systems  Positive:  Negative: See above   Physical Exam  BP 104/64 (BP Location: Left Arm)    Pulse (!) 58    Temp 98.2 F (36.8 C) (Oral)    Resp 14    Ht 5\' 7"  (1.702 m)    Wt 72.6 kg    LMP 01/26/2021    SpO2 100%    BMI 25.06 kg/m  Gen:   Awake, no distress   Resp:  Normal effort  MSK:   Moves extremities without difficulty  Other:  Cranial nerves II through XII are intact.  5/5 strength to the upper and lower extremities.  Normal sensation of the upper and lower extremities.  Pupils are equal round and reactive to light.  Normal extraocular movements.  Medical Decision Making  Medically screening exam initiated at 2:04 PM.  Appropriate orders placed.  Nicole Rose was informed that the remainder of the evaluation will be completed by another provider, this initial triage assessment does not replace that evaluation, and the importance of remaining in the ED until their evaluation is complete.     Nicole Rose [place], PA-C 02/16/21 1406    02/18/21, MD 02/16/21 [phone]

## 2021-02-16 NOTE — ED Notes (Signed)
Pt did not respond to last call

## 2021-02-16 NOTE — ED Triage Notes (Signed)
Pt reports hit her head on a metal shelf at work. Pt crying anxious on EMS arrival, A&O x4. Pt with a knot to forehead. Vss.

## 2021-02-16 NOTE — ED Notes (Signed)
Patient called, did not respond x3

## 2021-02-17 ENCOUNTER — Emergency Department (HOSPITAL_COMMUNITY): Payer: No Typology Code available for payment source

## 2021-02-17 ENCOUNTER — Emergency Department (HOSPITAL_COMMUNITY)
Admission: EM | Admit: 2021-02-17 | Discharge: 2021-02-17 | Disposition: A | Discharge status: Home / Self Care | Payer: No Typology Code available for payment source | Source: Home / Self Care | Attending: Emergency Medicine | Admitting: Emergency Medicine

## 2021-02-17 DIAGNOSIS — Z79899 Other long term (current) drug therapy: Secondary | ICD-10-CM | POA: Insufficient documentation

## 2021-02-17 DIAGNOSIS — Z91040 Latex allergy status: Secondary | ICD-10-CM | POA: Insufficient documentation

## 2021-02-17 DIAGNOSIS — Y990 Civilian activity done for income or pay: Secondary | ICD-10-CM | POA: Insufficient documentation

## 2021-02-17 DIAGNOSIS — M542 Cervicalgia: Secondary | ICD-10-CM

## 2021-02-17 DIAGNOSIS — F17210 Nicotine dependence, cigarettes, uncomplicated: Secondary | ICD-10-CM | POA: Insufficient documentation

## 2021-02-17 DIAGNOSIS — W228XXA Striking against or struck by other objects, initial encounter: Secondary | ICD-10-CM | POA: Insufficient documentation

## 2021-02-17 DIAGNOSIS — S0990XA Unspecified injury of head, initial encounter: Secondary | ICD-10-CM

## 2021-02-17 MED ORDER — IBUPROFEN 600 MG PO TABS: 600.0000 mg | ORAL_TABLET | Freq: Four times a day (QID) | ORAL | 0 refills | Status: DC | PRN

## 2021-02-17 MED ORDER — METHOCARBAMOL 500 MG PO TABS: 500.0000 mg | ORAL_TABLET | Freq: Two times a day (BID) | ORAL | 0 refills | Status: DC

## 2021-02-17 NOTE — ED Triage Notes (Signed)
Pt. Stated, I hit my head yesterday at work raising up . I have a headache, shoulder, and neck pain. Pt unsure of any LOC.

## 2021-02-17 NOTE — Discharge Instructions (Signed)
You were seen here today for evaluation of your head and neck injury.  Your imaging was normal.  This is likely musculoskeletal.  I have prescribed you ibuprofen 600 mg to take every 6 hours as needed for pain.  Additionally, I prescribed you Robaxin, a muscle laxer to take as needed for pain.  Please do not operate heavy machinery or drive while on Robaxin.  Additionally, do not take either medication through any chance of pregnancy.  If you have any concern, new or worsening symptoms, please return to the nearest emergency department for evaluation

## 2021-02-17 NOTE — ED Provider Notes (Signed)
Emergency Medicine Provider Triage Evaluation Note  Nicole Rose , a 30 y.o. female  was evaluated in triage.  Pt complains of head injury.  Patient reports yesterday afternoon she hit the top of her head.  She does not have full memory of the event, and is unsure if she passed out but reports all she remembers is hitting her head and then waking up laying on the floor.  She came yesterday via ambulance but did not stay for evaluation.  Since then has had persistent and worsening headache associated with some dizziness.  No vomiting, no visual changes, no numbness or weakness.  Reports she woke up today with severe neck pain as well that radiates into both shoulders.  No known injury to her shoulders with fall  Review of Systems  Positive: Headache, dizziness, neck pain Negative: Vomiting, visual changes, numbness, weakness  Physical Exam  LMP 01/26/2021  Gen:   Awake, no distress   Resp:  Normal effort  MSK:   Moves extremities without difficulty  Other:  No focal neurologic deficits, diffuse tenderness throughout the cervical spine and tenderness over bilateral trapezius muscles  Medical Decision Making  Medically screening exam initiated at 8:04 AM.  Appropriate orders placed.  Nicole Rose was informed that the remainder of the evaluation will be completed by another provider, this initial triage assessment does not replace that evaluation, and the importance of remaining in the ED until their evaluation is complete.     Dartha Lodge, PA-C 02/17/21 5320    Cathren Laine, MD 02/17/21 [phone]

## 2021-02-17 NOTE — ED Provider Notes (Signed)
[facility] EMERGENCY DEPARTMENT Provider Note   CSN: [id number] Arrival date & time: 02/17/21  1914     History Chief Complaint  Patient presents with   Head Injury   Headache   Shoulder Pain    Nicole Rose is a 30 y.o. female seen emergency department for evaluation of head and neck pain.  Yesterday, the patient was at work when she hit her head on a metal conveyor belt.  She reports that the incident is a little "fuzzy" and that she does remember waking up on the floor and seeing her manager.  Today, she complains of headache and neck pain.  She denies any blurry vision, weakness, numbness or tingling.  Denies any medical history.  Left wrist surgery.  Denies any daily medications.  Allergic to latex.  Daily smoker.   Head Injury Associated symptoms: headache and neck pain   Associated symptoms: no numbness, no seizures and no vomiting   Headache Associated symptoms: neck pain   Associated symptoms: no abdominal pain, no back pain, no cough, no dizziness, no ear pain, no eye pain, no fever, no numbness, no photophobia, no seizures, no sore throat, no vomiting and no weakness   Shoulder Pain Associated symptoms: neck pain   Associated symptoms: no back pain and no fever       Past Medical History:  Diagnosis Date   Anemia    no current med.   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     Family History  Problem Relation Age of Onset   Cancer Maternal Aunt        breast   Cancer Paternal Aunt        lung   Cancer Maternal Aunt        breast   Cancer Maternal Aunt        ovarian   Cancer Maternal Aunt        ovarian    Social History   Tobacco Use   Smoking status: Some Days    Packs/day: 0.00    Years: 7.00    Pack years: 0.00    Types: Cigarettes   Smokeless tobacco: Never   Tobacco comments:    6 cig./day  Vaping Use   Vaping Use: Never used  Substance Use Topics   Alcohol use: Yes    Comment: occasionally   Drug use: No    Home Medications Prior to Admission medications   Medication Sig  Start Date End Date Taking? Authorizing Provider  amoxicillin-clavulanate (AUGMENTIN) 875-125 MG tablet Take 1 tablet by mouth every 12 (twelve) hours. Patient not taking: No sig reported 01/14/20   Raylene Everts, MD  HYDROcodone-acetaminophen [facility]) 7.5-325 MG tablet Take 1 tablet by mouth every 6 (six) hours as needed for moderate pain. Patient not taking: No sig reported 01/14/20   Raylene Everts, MD  ibuprofen (ADVIL) 800 MG tablet Take 1 tablet (800 mg total) by mouth 3 (three) times daily. Patient not taking: No sig reported 01/14/20   Raylene Everts, MD  metroNIDAZOLE (FLAGYL) 500 MG tablet Take 1 tablet (500 mg total) by mouth 2 (two) times daily. 09/03/20   Pattricia Boss, MD  cetirizine (ZYRTEC) 10 MG tablet Take 1 tablet (10 mg total) by mouth daily. 03/16/18 12/22/19  Loura Halt A, NP  fluticasone (FLONASE)  50 MCG/ACT nasal spray Place 1-2 sprays into both nostrils daily for 7 days. 05/18/18 12/22/19  Wieters, Hallie C, PA-C    Allergies    Latex  Review of Systems   Review of Systems  Constitutional:  Negative for chills and fever.  HENT:  Negative for ear pain and sore throat.   Eyes:  Negative for photophobia, pain and visual disturbance.  Respiratory:  Negative for cough and shortness of breath.   Cardiovascular:  Negative for chest pain and palpitations.  Gastrointestinal:  Negative for abdominal pain and vomiting.  Genitourinary:  Negative for dysuria and hematuria.  Musculoskeletal:  Positive for neck pain. Negative for arthralgias and back pain.  Skin:  Negative for color change and rash.  Neurological:  Positive for headaches. Negative for dizziness, seizures, weakness and numbness.  All other systems reviewed and are negative.  Physical Exam Updated Vital Signs BP 106/74 (BP Location: Left Wrist)    Pulse 65    Temp 98.4 F (36.9 C) (Oral)    Resp 16    LMP 01/26/2021    SpO2 100%   Physical Exam Vitals and nursing note reviewed.  Constitutional:      General: She is not in acute distress.    Appearance: Normal appearance. She is not toxic-appearing.  HENT:     Head: Normocephalic and atraumatic.  Eyes:     General: No scleral icterus.    Extraocular Movements: Extraocular movements intact.     Pupils: Pupils are equal, round, and reactive to light.  Cardiovascular:     Rate and Rhythm: Normal rate and regular rhythm.  Pulmonary:     Effort: Pulmonary effort is normal.     Breath sounds: Normal breath sounds.  Abdominal:     General: Abdomen is flat. Bowel sounds are normal.     Palpations: Abdomen is soft.  Musculoskeletal:        General: Tenderness present. No deformity.     Cervical back: Normal range of motion.     Comments: Midline and paraspinal neck tenderness to palpation. No step offs or deformities noted. Full ROM  Skin:    General: Skin is warm and  dry.  Neurological:     General: No focal deficit present.     Mental Status: She is alert and oriented to person, place, and time. Mental status is at baseline.     GCS: GCS eye subscore is 4. GCS verbal subscore is 5. GCS motor subscore is 6.     Cranial Nerves: No cranial nerve deficit or facial asymmetry.     Sensory: No sensory deficit.  Motor: No weakness.     Comments: Answering questions appropriately using appropriate speech.  Cranial nerves II through XII grossly intact.  Sensation intact to bilateral upper and lower extremities.  Strength 5 out of 5 in upper and lower bilateral extremities.  Finger-nose intact.  No facial droop noted.    ED Results / Procedures / Treatments   Labs (all labs ordered are listed, but only abnormal results are displayed) Labs Reviewed - No data to display  EKG None  Radiology CT Head Wo Contrast  Result Date: 02/17/2021 CLINICAL DATA:  Head trauma.  Headache. EXAM: CT HEAD WITHOUT CONTRAST TECHNIQUE: Contiguous axial images were obtained from the base of the skull through the vertex without intravenous contrast. COMPARISON:  None. FINDINGS: Brain: No evidence of acute infarction, hemorrhage, hydrocephalus, extra-axial collection or mass lesion/mass effect. Vascular: No hyperdense vessel or unexpected calcification. Skull: Normal. Negative for fracture or focal lesion. Sinuses/Orbits: Visualized sinuses are aerated. There may be mild mucosal thickening in the sinuses. Other: None. IMPRESSION: Negative head CT. Electronically Signed   By: Markus Daft M.D.   On: 02/17/2021 09:48   CT Cervical Spine Wo Contrast  Result Date: 02/17/2021 CLINICAL DATA:  Neck trauma.  Midline tenderness. EXAM: CT CERVICAL SPINE WITHOUT CONTRAST TECHNIQUE: Multidetector CT imaging of the cervical spine was performed without intravenous contrast. Multiplanar CT image reconstructions were also generated. COMPARISON:  None. FINDINGS: Alignment: Kyphosis in the cervical  spine which is likely related to patient positioning. Skull base and vertebrae: No acute fracture. No primary bone lesion or focal pathologic process. Soft tissues and spinal canal: No prevertebral fluid or swelling. No visible canal hematoma. Disc levels:  Disc spaces are maintained. Upper chest: Negative. Other: None. IMPRESSION: No acute bone abnormality in the cervical spine. Electronically Signed   By: Markus Daft M.D.   On: 02/17/2021 09:57    Procedures Procedures   Medications Ordered in ED Medications - No data to display  ED Course  I have reviewed the triage vital signs and the nursing notes.  Pertinent labs & imaging results that were available during my care of the patient were reviewed by me and considered in my medical decision making (see chart for details). 30 year old female presents emergency department after head injury with possible loss of consciousness.  Complains of head and neck pain today.  CT of head and C-spine are negative with no acute bony abnormalities or intracranial abnormalities.  The patient has no photophobia and full range of motion of neck.  Cranial nerves II through XII intact.  No focal deficit.  Vital signs are stable.  Patient has normal blood pressure, afebrile, normal heart rate, and satting 100% on room air. It is likely musculoskeletal in nature with possible concussion.  Will prescribe patient muscle relaxers for paraspinal neck pain.  Strict return precautions given.  Patient agrees to plan.  Patient is stable and being discharged home in good condition.    MDM Rules/Calculators/A&P                           Final Clinical Impression(s) / ED Diagnoses Final diagnoses:  Injury of head, initial encounter  Neck pain    Rx / DC Orders ED Discharge Orders          Ordered    methocarbamol (ROBAXIN) 500 MG tablet  2 times daily        02/17/21 1412    ibuprofen (ADVIL) 600 MG tablet  Every 6 hours PRN        02/17/21 1412              Sherrell Puller, [place] 02/20/21 0700    Lajean Saver, MD 02/21/21 1214

## 2021-08-07 ENCOUNTER — Encounter (HOSPITAL_COMMUNITY): Payer: Self-pay

## 2021-08-07 ENCOUNTER — Emergency Department (HOSPITAL_COMMUNITY)
Admission: EM | Admit: 2021-08-07 | Discharge: 2021-08-07 | Discharge status: Against Medical Advice | Payer: BC Managed Care – PPO | Attending: Emergency Medicine | Admitting: Emergency Medicine

## 2021-08-07 DIAGNOSIS — Z5321 Procedure and treatment not carried out due to patient leaving prior to being seen by health care provider: Secondary | ICD-10-CM | POA: Insufficient documentation

## 2021-08-07 DIAGNOSIS — R1030 Lower abdominal pain, unspecified: Secondary | ICD-10-CM | POA: Insufficient documentation

## 2021-08-07 DIAGNOSIS — N939 Abnormal uterine and vaginal bleeding, unspecified: Secondary | ICD-10-CM | POA: Diagnosis not present

## 2021-08-07 LAB — I-STAT BETA HCG BLOOD, ED (MC, WL, AP ONLY): I-stat hCG, quantitative: <5 m[IU]/mL (ref <5)

## 2021-08-07 NOTE — ED Triage Notes (Signed)
Pt arrived via POV, c/o on and off light vaginal bleeding x1 month. States she saw a blood clot today and was concerned.

## 2021-08-07 NOTE — ED Provider Triage Note (Signed)
Emergency Medicine Provider Triage Evaluation Note  Nicole Rose , a 31 y.o. female  was evaluated in triage.  Pt complains of vaginal bleeding. Irregular periods this month. Passed large blood clot this morning and was concerned. Now having lower abdominal pain. Does not have an OBGYN.   Review of Systems  Positive: Abd pain, vaginal bleeding Negative: Fever, vomiting, urinary sx  Physical Exam  BP 129/79 (BP Location: Left Arm)   Pulse 88   Temp 97.8 F (36.6 C) (Oral)   Resp 18   Ht 5\' 8"  (1.727 m)   Wt 86.2 kg   SpO2 100%   BMI 28.89 kg/m  Gen:   Awake, no distress   Resp:  Normal effort  MSK:   Moves extremities without difficulty  Other:    Medical Decision Making  Medically screening exam initiated at 8:34 AM.  Appropriate orders placed.  Nicole Rose was informed that the remainder of the evaluation will be completed by another provider, this initial triage assessment does not replace that evaluation, and the importance of remaining in the ED until their evaluation is complete.     Nicole Rose, Nicole Rose 08/07/21 [phone]

## 2021-08-07 NOTE — ED Notes (Signed)
Light green and lavender to the lab for hold.

## 2021-08-09 ENCOUNTER — Emergency Department (HOSPITAL_COMMUNITY)
Admission: EM | Admit: 2021-08-09 | Discharge: 2021-08-09 | Disposition: A | Discharge status: Home / Self Care | Payer: BC Managed Care – PPO | Attending: Emergency Medicine | Admitting: Emergency Medicine

## 2021-08-09 ENCOUNTER — Encounter (HOSPITAL_COMMUNITY): Payer: Self-pay | Admitting: Emergency Medicine

## 2021-08-09 DIAGNOSIS — N760 Acute vaginitis: Secondary | ICD-10-CM | POA: Insufficient documentation

## 2021-08-09 DIAGNOSIS — R1030 Lower abdominal pain, unspecified: Secondary | ICD-10-CM

## 2021-08-09 DIAGNOSIS — Z91040 Latex allergy status: Secondary | ICD-10-CM | POA: Insufficient documentation

## 2021-08-09 DIAGNOSIS — N9489 Other specified conditions associated with female genital organs and menstrual cycle: Secondary | ICD-10-CM | POA: Insufficient documentation

## 2021-08-09 DIAGNOSIS — B9689 Other specified bacterial agents as the cause of diseases classified elsewhere: Secondary | ICD-10-CM | POA: Diagnosis not present

## 2021-08-09 DIAGNOSIS — R109 Unspecified abdominal pain: Secondary | ICD-10-CM | POA: Diagnosis present

## 2021-08-09 DIAGNOSIS — N939 Abnormal uterine and vaginal bleeding, unspecified: Secondary | ICD-10-CM

## 2021-08-09 LAB — CBC
HCT: 40.7 % (ref 36.0–46.0)
Hemoglobin: 12.9 g/dL (ref 12.0–15.0)
MCH: 30.2 pg (ref 26.0–34.0)
MCHC: 31.7 g/dL (ref 30.0–36.0)
MCV: 95.3 fL (ref 80.0–100.0)
Platelets: 297 10*3/uL (ref 150–400)
RBC: 4.27 MIL/uL (ref 3.87–5.11)
RDW: 13.1 % (ref 11.5–15.5)
WBC: 7.4 10*3/uL (ref 4.0–10.5)
nRBC: 0 % (ref 0.0–0.2)

## 2021-08-09 LAB — URINALYSIS, ROUTINE W REFLEX MICROSCOPIC
Bilirubin Urine: NEGATIVE
Glucose, UA: NEGATIVE mg/dL
Hgb urine dipstick: NEGATIVE
Ketones, ur: NEGATIVE mg/dL
Nitrite: NEGATIVE
Protein, ur: NEGATIVE mg/dL
Specific Gravity, Urine: 1.016 (ref 1.005–1.030)
pH: 5 (ref 5.0–8.0)

## 2021-08-09 LAB — COMPREHENSIVE METABOLIC PANEL
ALT: 13 U/L (ref 0–44)
AST: 20 U/L (ref 15–41)
Albumin: 3.7 g/dL (ref 3.5–5.0)
Alkaline Phosphatase: 58 U/L (ref 38–126)
Anion gap: 6 (ref 5–15)
BUN: 7 mg/dL (ref 6–20)
CO2: 23 mmol/L (ref 22–32)
Calcium: 9.2 mg/dL (ref 8.9–10.3)
Chloride: 111 mmol/L (ref 98–111)
Creatinine, Ser: 0.73 mg/dL (ref 0.44–1.00)
GFR, Estimated: >60 mL/min (ref >60)
Glucose, Bld: 101 mg/dL — ABNORMAL HIGH (ref 70–99)
Potassium: 3.3 mmol/L — ABNORMAL LOW (ref 3.5–5.1)
Sodium: 140 mmol/L (ref 135–145)
Total Bilirubin: 0.4 mg/dL (ref 0.3–1.2)
Total Protein: 7.3 g/dL (ref 6.5–8.1)

## 2021-08-09 LAB — TYPE AND SCREEN
ABO/RH(D): O POS
Antibody Screen: NEGATIVE

## 2021-08-09 LAB — WET PREP, GENITAL
Sperm: NONE SEEN
Trich, Wet Prep: NONE SEEN
WBC, Wet Prep HPF POC: >=10 — AB (ref <10)
Yeast Wet Prep HPF POC: NONE SEEN

## 2021-08-09 LAB — I-STAT BETA HCG BLOOD, ED (MC, WL, AP ONLY): I-stat hCG, quantitative: <5 m[IU]/mL (ref <5)

## 2021-08-09 LAB — LIPASE, BLOOD: Lipase: 25 U/L (ref 11–51)

## 2021-08-09 MED ORDER — METRONIDAZOLE 500 MG PO TABS: 500.0000 mg | ORAL_TABLET | Freq: Two times a day (BID) | ORAL | 0 refills | Status: DC

## 2021-08-09 MED ORDER — ONDANSETRON 4 MG PO TBDP
4.0000 mg | ORAL_TABLET | Freq: Once | ORAL | Status: AC
  Administered 2021-08-09: 4 mg via ORAL
  Filled 2021-08-09: qty 1

## 2021-08-09 MED ORDER — POTASSIUM CHLORIDE CRYS ER 20 MEQ PO TBCR
40.0000 meq | EXTENDED_RELEASE_TABLET | Freq: Once | ORAL | Status: AC
  Administered 2021-08-09: 40 meq via ORAL
  Filled 2021-08-09: qty 2

## 2021-08-09 NOTE — ED Provider Triage Note (Cosign Needed)
Emergency Medicine Provider Triage Evaluation Note  Nicole Rose , a 31 y.o. female  was evaluated in triage.  Pt complains of abd pain. Report lower abd cramping x 1 week, intermittent vaginal bleeding x 1 month, endorse nausea and vomiting.  Admits marijuana use.  No fever, dysuria, vaginal discharge  Review of Systems  Positive: As above Negative: As above  Physical Exam  BP 117/81 (BP Location: Left Arm)   Pulse 95   Temp 98.3 F (36.8 C) (Oral)   Resp 15   SpO2 97%  Gen:   Awake, no distress   Resp:  Normal effort  MSK:   Moves extremities without difficulty  Other:    Medical Decision Making  Medically screening exam initiated at 4:49 PM.  Appropriate orders placed.  Nicole Rose was informed that the remainder of the evaluation will be completed by another provider, this initial triage assessment does not replace that evaluation, and the importance of remaining in the ED until their evaluation is complete.     Fayrene Helper, PA-C 08/09/21 1652

## 2021-08-09 NOTE — ED Triage Notes (Signed)
Patient here from home reporting lower abd pain, vaginal bleeding for 1 month, n/v. Actively vomiting in triage.

## 2021-08-09 NOTE — Discharge Instructions (Addendum)
You have been evaluated for your lower abdominal pain and your recurrent vaginal bleeding.  No significant drop in your blood count today however please follow-up with OB/GYN for further assessments of your condition.  You have been diagnosed with bacterial vaginosis, please take Flagyl as prescribed but avoid drinking alcohol while taking this medication as it can cause side effect.

## 2021-08-09 NOTE — ED Provider Notes (Cosign Needed)
[facility]-EMERGENCY DEPT Provider Note   CSN: [id number] Arrival date & time: 08/09/21  1614     History  Chief Complaint  Patient presents with   Abdominal Pain   Nausea   Emesis    Nicole Rose is a 31 y.o. female.  The history is provided by the patient and medical records. No language interpreter was used.  Abdominal Pain Associated symptoms: vomiting   Emesis Associated symptoms: abdominal pain     31 year old female significant history of marijuana use who presents for evaluation of abdominal pain.  Patient endorsed having lower abdominal pain ongoing for the past week.  She described pain as a cramping sensation waxing and waning usually improves when she rubs her abdomen and stay in the fetal position.  When the pain is intense sometimes she feels nauseous.  She also endorsed having intermittent vaginal bleeding for the past month going through a few pads a day.  She denies having fever, dysuria, vaginal discharge, or rash.  No new sexual partner.  Her mom previously passed away due to having cancer likely uterine.  She was concerned.  Currently rates her pain as 3 out of 10.  Home Medications Prior to Admission medications   Medication Sig Start Date End Date Taking? Authorizing Provider  amoxicillin-clavulanate (AUGMENTIN) 875-125 MG tablet Take 1 tablet by mouth every 12 (twelve) hours. Patient not taking: No sig reported 01/14/20   Eustace Moore, MD  HYDROcodone-acetaminophen [facility]) 7.5-325 MG tablet Take 1 tablet by mouth every 6 (six) hours as needed for moderate pain. Patient not taking: No sig reported 01/14/20   Eustace Moore, MD  ibuprofen (ADVIL) 600 MG tablet Take 1 tablet (600 mg total) by mouth every 6 (six) hours as needed. 02/17/21   Achille Rich, PA-C  methocarbamol (ROBAXIN) 500 MG tablet Take 1 tablet (500 mg total) by mouth 2 (two) times daily. 02/17/21   Achille Rich, PA-C  metroNIDAZOLE (FLAGYL) 500 MG tablet  Take 1 tablet (500 mg total) by mouth 2 (two) times daily. 09/03/20   Margarita Grizzle, MD  cetirizine (ZYRTEC) 10 MG tablet Take 1 tablet (10 mg total) by mouth daily. 03/16/18 12/22/19  Dahlia Byes A, NP  fluticasone (FLONASE) 50 MCG/ACT nasal spray Place 1-2 sprays into both nostrils daily for 7 days. 05/18/18 12/22/19  Wieters, Hallie C, PA-C      Allergies    Latex    Review of Systems   Review of Systems  Gastrointestinal:  Positive for abdominal pain and vomiting.  All other systems reviewed and are negative.   Physical Exam Updated Vital Signs BP 117/81 (BP Location: Left Arm)   Pulse 95   Temp 98.3 F (36.8 C) (Oral)   Resp 15   SpO2 97%  Physical Exam Vitals and nursing note reviewed.  Constitutional:      General: She is not in acute distress.    Appearance: She is well-developed.  HENT:     Head: Atraumatic.  Eyes:     Conjunctiva/sclera: Conjunctivae normal.  Pulmonary:     Effort: Pulmonary effort is normal.  Abdominal:     General: Bowel sounds are normal.     Palpations: Abdomen is soft.     Tenderness: There is no abdominal tenderness.  Musculoskeletal:     Cervical back: Neck supple.  Skin:    Findings: No rash.  Neurological:     Mental Status: She is alert.  Psychiatric:        Mood  and Affect: Mood normal.     ED Results / Procedures / Treatments   Labs (all labs ordered are listed, but only abnormal results are displayed) Labs Reviewed  WET PREP, GENITAL - Abnormal; Notable for the following components:      Result Value   Clue Cells Wet Prep HPF POC PRESENT (*)    WBC, Wet Prep HPF POC >=10 (*)    All other components within normal limits  COMPREHENSIVE METABOLIC PANEL - Abnormal; Notable for the following components:   Potassium 3.3 (*)    Glucose, Bld 101 (*)    All other components within normal limits  URINALYSIS, ROUTINE W REFLEX MICROSCOPIC - Abnormal; Notable for the following components:   Leukocytes,Ua SMALL (*)    Bacteria, UA  RARE (*)    All other components within normal limits  LIPASE, BLOOD  CBC  I-STAT BETA HCG BLOOD, ED ([facility], [facility], [facility] ONLY)  TYPE AND SCREEN  ABO/RH  GC/CHLAMYDIA PROBE AMP ([place]) NOT AT [facility]    EKG None  Radiology No results found.  Procedures Pelvic exam  Date/Time: 08/09/2021 8:38 PM  Performed by: Fayrene Helper, PA-C Authorized by: Fayrene Helper, PA-C  Comments: Chaperone present during exam.  No inguinal lymphadenopathy or inguinal hernia noted.  Normal external genital.  No discomfort with speculum insertion.  closed cervical os free of lesion or rash.  Vaginal discharge noted.  No adnexal tenderness or cervical motion tenderness.       Medications Ordered in ED Medications  ondansetron (ZOFRAN-ODT) disintegrating tablet 4 mg (has no administration in time range)    ED Course/ Medical Decision Making/ A&P                           Medical Decision Making Amount and/or Complexity of Data Reviewed Labs: ordered.  Risk Prescription drug management.   BP 111/64   Pulse 87   Temp 98.3 F (36.8 C) (Oral)   Resp 16   SpO2 100%   49:48 PM 31 year old female here with complaints of recurrent lower abdominal discomfort for the past week as well as having prolonged menstruation for the past month.  Reports mom died from some form of either ovarian or uterine cancer.  She does use marijuana on occasion.  She endorses nausea when the pain is intense.  She denies any vaginal discharge or new sexual partner.  No fever or chills.  On initial exam patient is overall well-appearing appears to be in no acute discomfort.  Palpation of abdomen without any reproducible point tenderness.  She is afebrile, vital signs stable.  Labs was obtained and independently reviewed and interpreted by me.  Pregnancy test is negative, mild hypokalemia with a potassium of 3.3, supplementation given, normal WBC, normal H&H.  Urinalysis currently pending.  We will also perform pelvic exam.  8:37  PM Pelvis exam performed by me notable for vaginal discharge but no active bleeding no concerning lesion and no adnexal tenderness or cervical motion tenderness suggestive of PID.  Patient states she has been working in a new job where she pressed her lower abdomen against her table on a continual basis for 10 hours per shift and was wondering if her lower abdominal pain could be related to that.  I suspect that may play a role.  At this time I have low suspicion for acute abdominal pathology such as appendicitis, cervicitis, diverticulitis, colitis, PID, UTI, kidney stone.  9:50 PM Wet prep obtained  positive for clue cells.  Since patient does have vaginal discharge and some irritation, will treat for BV.  Otherwise at this time patient is stable for discharge.  Outpatient follow-up recommended.  Return precaution given.        Final Clinical Impression(s) / ED Diagnoses Final diagnoses:  BV (bacterial vaginosis)  Lower abdominal pain  Abnormal uterine bleeding    Rx / DC Orders ED Discharge Orders          Ordered    metroNIDAZOLE (FLAGYL) 500 MG tablet  2 times daily        08/09/21 2153              Fayrene Helper, PA-C 08/09/21 2154

## 2021-08-13 LAB — GC/CHLAMYDIA PROBE AMP (~~LOC~~) NOT AT ARMC
Chlamydia: POSITIVE — AB
Comment: NEGATIVE
Comment: NORMAL
Neisseria Gonorrhea: POSITIVE — AB

## 2022-03-29 ENCOUNTER — Encounter (HOSPITAL_COMMUNITY): Payer: Self-pay | Admitting: Emergency Medicine

## 2022-03-29 ENCOUNTER — Emergency Department (HOSPITAL_COMMUNITY)
Admission: EM | Admit: 2022-03-29 | Discharge: 2022-03-30 | Disposition: A | Discharge status: Home / Self Care | Payer: BC Managed Care – PPO | Attending: Emergency Medicine | Admitting: Emergency Medicine

## 2022-03-29 ENCOUNTER — Other Ambulatory Visit: Payer: Self-pay

## 2022-03-29 ENCOUNTER — Ambulatory Visit
Admission: EM | Admit: 2022-03-29 | Discharge: 2022-03-29 | Disposition: A | Discharge status: Home / Self Care | Payer: BC Managed Care – PPO

## 2022-03-29 DIAGNOSIS — R0981 Nasal congestion: Secondary | ICD-10-CM | POA: Diagnosis present

## 2022-03-29 DIAGNOSIS — Z20822 Contact with and (suspected) exposure to covid-19: Secondary | ICD-10-CM | POA: Diagnosis not present

## 2022-03-29 DIAGNOSIS — Z1152 Encounter for screening for COVID-19: Secondary | ICD-10-CM | POA: Diagnosis not present

## 2022-03-29 DIAGNOSIS — R112 Nausea with vomiting, unspecified: Secondary | ICD-10-CM | POA: Diagnosis not present

## 2022-03-29 DIAGNOSIS — R051 Acute cough: Secondary | ICD-10-CM

## 2022-03-29 DIAGNOSIS — J101 Influenza due to other identified influenza virus with other respiratory manifestations: Secondary | ICD-10-CM | POA: Insufficient documentation

## 2022-03-29 DIAGNOSIS — R197 Diarrhea, unspecified: Secondary | ICD-10-CM | POA: Diagnosis not present

## 2022-03-29 LAB — URINALYSIS, ROUTINE W REFLEX MICROSCOPIC
Bilirubin Urine: NEGATIVE
Glucose, UA: NEGATIVE mg/dL
Hgb urine dipstick: NEGATIVE
Ketones, ur: 5 mg/dL — AB
Leukocytes,Ua: NEGATIVE
Nitrite: NEGATIVE
Protein, ur: 100 mg/dL — AB
Specific Gravity, Urine: 1.031 — ABNORMAL HIGH (ref 1.005–1.030)
pH: 5 (ref 5.0–8.0)

## 2022-03-29 LAB — COMPREHENSIVE METABOLIC PANEL
ALT: 17 U/L (ref 0–44)
AST: 41 U/L (ref 15–41)
Albumin: 3.6 g/dL (ref 3.5–5.0)
Alkaline Phosphatase: 51 U/L (ref 38–126)
Anion gap: 11 (ref 5–15)
BUN: 9 mg/dL (ref 6–20)
CO2: 19 mmol/L — ABNORMAL LOW (ref 22–32)
Calcium: 8.9 mg/dL (ref 8.9–10.3)
Chloride: 107 mmol/L (ref 98–111)
Creatinine, Ser: 0.89 mg/dL (ref 0.44–1.00)
GFR, Estimated: >60 mL/min (ref >60)
Glucose, Bld: 92 mg/dL (ref 70–99)
Potassium: 3.3 mmol/L — ABNORMAL LOW (ref 3.5–5.1)
Sodium: 137 mmol/L (ref 135–145)
Total Bilirubin: 0.2 mg/dL — ABNORMAL LOW (ref 0.3–1.2)
Total Protein: 7.2 g/dL (ref 6.5–8.1)

## 2022-03-29 LAB — CBC WITH DIFFERENTIAL/PLATELET
Abs Immature Granulocytes: 0 10*3/uL (ref 0.00–0.07)
Basophils Absolute: 0 10*3/uL (ref 0.0–0.1)
Basophils Relative: 1 %
Eosinophils Absolute: 0 10*3/uL (ref 0.0–0.5)
Eosinophils Relative: 0 %
HCT: 39.6 % (ref 36.0–46.0)
Hemoglobin: 13.4 g/dL (ref 12.0–15.0)
Lymphocytes Relative: 44 %
Lymphs Abs: 1.3 10*3/uL (ref 0.7–4.0)
MCH: 30.5 pg (ref 26.0–34.0)
MCHC: 33.8 g/dL (ref 30.0–36.0)
MCV: 90 fL (ref 80.0–100.0)
Monocytes Absolute: 0.7 10*3/uL (ref 0.1–1.0)
Monocytes Relative: 23 %
Neutro Abs: 0.9 10*3/uL — ABNORMAL LOW (ref 1.7–7.7)
Neutrophils Relative %: 32 %
Platelets: 193 10*3/uL (ref 150–400)
RBC: 4.4 MIL/uL (ref 3.87–5.11)
RDW: 13 % (ref 11.5–15.5)
WBC: 2.9 10*3/uL — ABNORMAL LOW (ref 4.0–10.5)
nRBC: 0 % (ref 0.0–0.2)
nRBC: 0 /100 WBC

## 2022-03-29 LAB — PREGNANCY, URINE: Preg Test, Ur: NEGATIVE

## 2022-03-29 LAB — RESP PANEL BY RT-PCR (RSV, FLU A&B, COVID)  RVPGX2
Influenza A by PCR: NEGATIVE
Influenza B by PCR: POSITIVE — AB
Resp Syncytial Virus by PCR: NEGATIVE
SARS Coronavirus 2 by RT PCR: NEGATIVE

## 2022-03-29 MED ORDER — ONDANSETRON 4 MG PO TBDP
4.0000 mg | ORAL_TABLET | Freq: Once | ORAL | Status: AC
  Administered 2022-03-29: 4 mg via ORAL
  Filled 2022-03-29: qty 1

## 2022-03-29 MED ORDER — ONDANSETRON HCL 4 MG/2ML IJ SOLN
4.0000 mg | Freq: Once | INTRAMUSCULAR | Status: AC
  Administered 2022-03-29: 4 mg via INTRAVENOUS
  Filled 2022-03-29: qty 2

## 2022-03-29 MED ORDER — KETOROLAC TROMETHAMINE 30 MG/ML IJ SOLN
30.0000 mg | Freq: Once | INTRAMUSCULAR | Status: AC
  Administered 2022-03-29: 30 mg via INTRAVENOUS
  Filled 2022-03-29: qty 1

## 2022-03-29 MED ORDER — SODIUM CHLORIDE 0.9 % IV BOLUS
1000.0000 mL | Freq: Once | INTRAVENOUS | Status: AC
  Administered 2022-03-29: 1000 mL via INTRAVENOUS

## 2022-03-29 NOTE — ED Notes (Signed)
Patient is being discharged from the Urgent Care and sent to the Emergency Department via POV . Per [place], NP, patient is in need of higher level of care due to dehydration. Patient is aware and verbalizes understanding of plan of care.  Vitals:   03/29/22 1752  BP: 103/73  Pulse: (!) 109  Resp: 18  Temp: 100.1 F (37.8 C)  SpO2: 96%

## 2022-03-29 NOTE — ED Triage Notes (Signed)
PT reports body aches, vomiting, diarrhea, and abdominal pain that started 1 week ago. Pt reports she has been unable to eat or drink.

## 2022-03-29 NOTE — ED Triage Notes (Signed)
Patient presents to UC for body aches, vomiting, and abdominal pain since 4-5 days ago. States she is unable to tolerate po intake these past 4-5 days and not taking anything for symptoms.  States she has been exposed to positive covid cases at work.   Denies urinary symptoms or fever .

## 2022-03-29 NOTE — ED Provider Notes (Signed)
[facility]    CSN: [id number] Arrival date & time: 03/29/22  1738      History   Chief Complaint Chief Complaint  Patient presents with   Abdominal Pain   Emesis   Generalized Body Aches    HPI Nicole Rose is a 32 y.o. female.   Patient presents with abdominal pain, nausea, vomiting, generalized bodyaches, cough, diarrhea that started about 4 to 5 days ago.  Diarrhea has now resolved. Patient reports that she has been having difficulty keeping food and fluids down since symptoms started.  Pain is located in the lower abdomen, is constant, is described as a cramping sensation, and is rated 8/10 on pain scale.  She reports that she noticed some blood in her emesis today that was bright red in color.  Denies blood in stool.  Patient reports that she has been feeling like she is going to pass out today and is concerned for dehydration.  Denies any associated upper respiratory symptoms or fever.  Patient does report that she has been exposed to COVID-19.  Patient started her menstrual cycle today.  Denies any associated urinary symptoms.   Abdominal Pain Emesis   Past Medical History:  Diagnosis Date   Anemia    no current med.   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      Home Medications    Prior to Admission medications   Medication Sig Start Date End Date Taking? Authorizing Provider  ibuprofen (ADVIL) 600 MG tablet Take 1 tablet (600 mg total) by mouth every 6 (six) hours as needed. 02/17/21   Sherrell Puller, PA-C  methocarbamol (ROBAXIN) 500 MG tablet Take 1 tablet (500 mg total) by mouth 2 (two) times daily. 02/17/21   Sherrell Puller, PA-C  metroNIDAZOLE (FLAGYL) 500 MG tablet Take 1 tablet (500 mg total) by mouth 2 (two) times daily. 08/09/21   Domenic Moras, PA-C  cetirizine (ZYRTEC) 10 MG tablet Take 1 tablet (10 mg total) by mouth daily. 03/16/18 12/22/19  Loura Halt A, NP  fluticasone (FLONASE) 50 MCG/ACT nasal spray Place 1-2 sprays into both nostrils daily for 7 days. 05/18/18 12/22/19  Wieters, Elesa Hacker, PA-C    Family History Family History  Problem Relation Age of Onset   Cancer Maternal Aunt        breast   Cancer Paternal Aunt        lung   Cancer Maternal Aunt  breast   Cancer Maternal Aunt        ovarian   Cancer Maternal Aunt        ovarian    Social History Social History   Tobacco Use   Smoking status: Some Days    Packs/day: 0.00    Years: 7.00    Total pack years: 0.00    Types: Cigarettes   Smokeless tobacco: Never   Tobacco comments:    6 cig./day  Vaping Use   Vaping Use: Never used  Substance Use Topics   Alcohol use: Yes    Comment: occasionally   Drug use: No     Allergies   Latex   Review of Systems Review of Systems Per HPI  Physical Exam Triage Vital Signs ED Triage Vitals  Enc Vitals Group     BP 03/29/22 1752 103/73     Pulse Rate 03/29/22 1752 (!) 109     Resp 03/29/22 1752 18      Temp 03/29/22 1752 100.1 F (37.8 C)     Temp Source 03/29/22 1752 Oral     SpO2 03/29/22 1752 96 %     Weight --      Height --      Head Circumference --      Peak Flow --      Pain Score 03/29/22 1750 8     Pain Loc --      Pain Edu? --      Excl. in [place]? --    No data found.  Updated Vital Signs BP 103/73 (BP Location: Left Arm)   Pulse (!) 109   Temp 100.1 F (37.8 C) (Oral)   Resp 18   LMP 03/29/2022   SpO2 96%   Visual Acuity Right Eye Distance:   Left Eye Distance:   Bilateral Distance:    Right Eye Near:   Left Eye Near:    Bilateral Near:     Physical Exam Constitutional:      General: She is not in acute distress.    Appearance: Normal appearance. She is not toxic-appearing or diaphoretic.  HENT:     Head: Normocephalic and atraumatic.     Right Ear: Tympanic membrane and ear canal normal.     Left Ear: Tympanic membrane and ear canal normal.     Nose: Nose normal. No congestion.     Mouth/Throat:     Mouth: Mucous membranes are dry.     Pharynx: No posterior oropharyngeal erythema.  Eyes:     Extraocular Movements: Extraocular movements intact.     Conjunctiva/sclera: Conjunctivae normal.     Pupils: Pupils are equal, round, and reactive to light.  Cardiovascular:     Rate and Rhythm: Normal rate and regular rhythm.     Pulses: Normal pulses.     Heart sounds: Normal heart sounds.  Pulmonary:     Effort: Pulmonary effort is normal. No respiratory distress.     Breath sounds: No stridor. Wheezing present. No rhonchi or rales.     Comments: Mild wheezing to auscultation at right lung. Abdominal:     General: Abdomen is flat. Bowel sounds are normal.     Palpations: Abdomen is soft.  Musculoskeletal:        General: Normal range of motion.     Cervical back: Normal range of motion.  Skin:    General: Skin is warm and dry.  Neurological:     General: No focal deficit present.  Mental Status: She is alert and oriented to person, place, and  time. Mental status is at baseline.  Psychiatric:        Mood and Affect: Mood normal.        Behavior: Behavior normal.      UC Treatments / Results  Labs (all labs ordered are listed, but only abnormal results are displayed) Labs Reviewed - No data to display  EKG   Radiology No results found.  Procedures Procedures (including critical care time)  Medications Ordered in UC Medications - No data to display  Initial Impression / Assessment and Plan / UC Course  I have reviewed the triage vital signs and the nursing notes.  Pertinent labs & imaging results that were available during my care of the patient were reviewed by me and considered in my medical decision making (see chart for details).     Patient most likely has a viral illness and I am concerned for COVID-19 given close exposure.  Although, I am also concerned for dehydration given that patient has been feeling like she is going to pass out and has not been able to tolerate food and fluids well recently.  She also reports blood in emesis which is concerning.  Given all of these factors, I do think that she needs higher level of care at emergency department.  Advised patient to go to the ER for further evaluation and management and she was agreeable with plan.  Patient wished to have a friend transport her.  Left via self transport.  Vital signs and patient stable at discharge. Final Clinical Impressions(s) / UC Diagnoses   Final diagnoses:  Nausea vomiting and diarrhea  Acute cough  Exposure to COVID-19 virus     Discharge Instructions      Please go to the emergency department as soon as you leave urgent care for further evaluation and management.    ED Prescriptions   None    PDMP not reviewed this encounter.   Teodora Medici, [place] 03/29/22 1807

## 2022-03-29 NOTE — Discharge Instructions (Signed)
Please go to the emergency department as soon as you leave urgent care for further evaluation and management. ?

## 2022-03-29 NOTE — ED Provider Notes (Signed)
[facility] Provider Note   CSN: [id number] Arrival date & time: 03/29/22  [id number]     History {Add pertinent medical, surgical, social history, OB history to HPI:1} Chief Complaint  Patient presents with   Emesis    Nicole Rose is a 32 y.o. female.  The history is provided by the patient and medical records.  Emesis Associated symptoms: cough, diarrhea and fever    33 y.o. F here with NVD x5 days.  States also having a cough with some nasal congestion.  Thinks she picked up illness from her job as several people have been out recently with similar symptoms.  She reports subjective fever at home-- temp 100.33F upon arrival to the ED.  She denies urinary symptoms.  No chest pain or SOB.  States some abdominal discomfort, more so with movement and during retching periods.  Diarrhea resolved 2 days ago.  Has tried eating/drinking today but cannot hold anything down.  Initially seen at [facility] and sent here due to concern for dehydration.  Home Medications Prior to Admission medications   Medication Sig Start Date End Date Taking? Authorizing Provider  ibuprofen (ADVIL) 600 MG tablet Take 1 tablet (600 mg total) by mouth every 6 (six) hours as needed. 02/17/21   Sherrell Puller, PA-C  methocarbamol (ROBAXIN) 500 MG tablet Take 1 tablet (500 mg total) by mouth 2 (two) times daily. 02/17/21   Sherrell Puller, PA-C  metroNIDAZOLE (FLAGYL) 500 MG tablet Take 1 tablet (500 mg total) by mouth 2 (two) times daily. 08/09/21   Domenic Moras, PA-C  cetirizine (ZYRTEC) 10 MG tablet Take 1 tablet (10 mg total) by mouth daily. 03/16/18 12/22/19  Loura Halt A, NP  fluticasone (FLONASE) 50 MCG/ACT nasal spray Place 1-2 sprays into both nostrils daily for 7 days. 05/18/18 12/22/19  Wieters, Hallie C, PA-C      Allergies    Latex    Review of Systems   Review of Systems  Constitutional:  Positive for fever.  Respiratory:  Positive for cough.   Gastrointestinal:   Positive for diarrhea, nausea and vomiting.  All other systems reviewed and are negative.   Physical Exam Updated Vital Signs BP 123/80 (BP Location: Left Arm)   Pulse 97   Temp 100.3 F (37.9 C)   Resp 20   LMP 03/29/2022   SpO2 99%   Physical Exam Vitals and nursing note reviewed.  Constitutional:      Appearance: She is well-developed.  HENT:     Head: Normocephalic and atraumatic.     Mouth/Throat:     Comments: Dry mucous membranes Eyes:     Conjunctiva/sclera: Conjunctivae normal.     Pupils: Pupils are equal, round, and reactive to light.  Cardiovascular:     Rate and Rhythm: Normal rate and regular rhythm.     Heart sounds: Normal heart sounds.  Pulmonary:     Effort: Pulmonary effort is normal.     Breath sounds: Normal breath sounds.     Comments: Dry cough, lung sounds clear, no acute distress, able to speak in sentences without difficulty Abdominal:     General: Bowel sounds are normal.     Palpations: Abdomen is soft.     Tenderness: There is no abdominal tenderness. There is no rebound.     Comments: Soft, non-tender, no peritoneal signs  Musculoskeletal:        General: Normal range of motion.     Cervical back: Normal range of  motion.  Skin:    General: Skin is warm and dry.  Neurological:     Mental Status: She is alert and oriented to person, place, and time.     ED Results / Procedures / Treatments   Labs (all labs ordered are listed, but only abnormal results are displayed) Labs Reviewed  RESP PANEL BY RT-PCR (RSV, FLU A&B, COVID)  RVPGX2 - Abnormal; Notable for the following components:      Result Value   Influenza B by PCR POSITIVE (*)    All other components within normal limits  CBC WITH DIFFERENTIAL/PLATELET - Abnormal; Notable for the following components:   WBC 2.9 (*)    Neutro Abs 0.9 (*)    All other components within normal limits  COMPREHENSIVE METABOLIC PANEL - Abnormal; Notable for the following components:   Potassium 3.3  (*)    CO2 19 (*)    Total Bilirubin 0.2 (*)    All other components within normal limits  URINALYSIS, ROUTINE W REFLEX MICROSCOPIC - Abnormal; Notable for the following components:   Color, Urine AMBER (*)    Specific Gravity, Urine 1.031 (*)    Ketones, ur 5 (*)    Protein, ur 100 (*)    Bacteria, UA RARE (*)    All other components within normal limits  PREGNANCY, URINE    EKG None  Radiology No results found.  Procedures Procedures  {Document cardiac monitor, telemetry assessment procedure when appropriate:1}  Medications Ordered in ED Medications  sodium chloride 0.9 % bolus 1,000 mL (has no administration in time range)  ketorolac (TORADOL) 30 MG/ML injection 30 mg (has no administration in time range)  ondansetron (ZOFRAN) injection 4 mg (has no administration in time range)  ondansetron (ZOFRAN-ODT) disintegrating tablet 4 mg (4 mg Oral Given 03/29/22 1929)    ED Course/ Medical Decision Making/ A&P   {   Click here for ABCD2, HEART and other calculatorsREFRESH Note before signing :1}                          Medical Decision Making Risk Prescription drug management.   ***  {Document critical care time when appropriate:1} {Document review of labs and clinical decision tools ie heart score, Chads2Vasc2 etc:1}  {Document your independent review of radiology images, and any outside records:1} {Document your discussion with family members, caretakers, and with consultants:1} {Document social determinants of health affecting pt's care:1} {Document your decision making why or why not admission, treatments were needed:1} Final Clinical Impression(s) / ED Diagnoses Final diagnoses:  None    Rx / DC Orders ED Discharge Orders     None

## 2022-03-29 NOTE — ED Provider Triage Note (Signed)
Emergency Medicine Provider Triage Evaluation Note  Nicole Rose , a 32 y.o. female  was evaluated in triage.  Pt complains of vomiting.  She reports that vomiting began on Sunday about 5 days ago and has not improved.  She reports that she has had some associated subjective fevers and lower abdominal tenderness but denies any cough congestion or diarrhea.  Patient does not believe that she could be pregnant at this time.  No obvious known sick contacts.  Not currently on any medications.  Review of Systems  Positive: As above Negative: As above  Physical Exam  BP 123/80 (BP Location: Left Arm)   Pulse 97   Temp 100.3 F (37.9 C)   Resp 20   LMP 03/29/2022   SpO2 99%  Gen:   Awake, no distress   Resp:  Normal effort  MSK:   Moves extremities without difficulty  Other:    Medical Decision Making  Medically screening exam initiated at 7:22 PM.  Appropriate orders placed.  Nicole Rose was informed that the remainder of the evaluation will be completed by another provider, this initial triage assessment does not replace that evaluation, and the importance of remaining in the ED until their evaluation is complete.     Nicole Heller, PA-C 03/29/22 1923

## 2022-03-30 MED ORDER — ONDANSETRON 4 MG PO TBDP: 4.0000 mg | ORAL_TABLET | Freq: Three times a day (TID) | ORAL | 0 refills | Status: DC | PRN

## 2022-03-30 NOTE — Discharge Instructions (Signed)
You tested positive for Flu B today.  This is likely causing your symptoms. Make sure to rest and hydrate.  Use zofran for nausea. Follow-up with your primary care doctor. Return here for new concerns.

## 2022-04-01 LAB — PATHOLOGIST SMEAR REVIEW

## 2022-09-25 ENCOUNTER — Ambulatory Visit
Admission: EM | Admit: 2022-09-25 | Discharge: 2022-09-25 | Disposition: A | Discharge status: Home / Self Care | Payer: 59 | Attending: Physician Assistant | Admitting: Physician Assistant

## 2022-09-25 DIAGNOSIS — Z3202 Encounter for pregnancy test, result negative: Secondary | ICD-10-CM

## 2022-09-25 DIAGNOSIS — N926 Irregular menstruation, unspecified: Secondary | ICD-10-CM

## 2022-09-25 LAB — POCT URINE PREGNANCY: Preg Test, Ur: NEGATIVE

## 2022-09-25 NOTE — ED Provider Notes (Signed)
[facility]    CSN: [id number] Arrival date & time: 09/25/22  1704      History   Chief Complaint Chief Complaint  Patient presents with   Possible Pregnancy    HPI Nicole Rose is a 32 y.o. female.   She here today pregnancy screening.  She reports that she has not taken a test at home but states she had to menstrual cycles last month which was abnormal for her.  She has not had any other symptoms.  The history is provided by the patient.  Possible Pregnancy Pertinent negatives include no abdominal pain.    Past Medical History:  Diagnosis Date   Anemia    no current med.   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      Home Medications    Prior to Admission medications   Medication Sig Start Date End Date Taking? Authorizing Provider  ibuprofen (ADVIL) 600 MG tablet Take 1 tablet (600 mg total) by mouth every 6 (six) hours as needed. 02/17/21   Achille Rich, PA-C   methocarbamol (ROBAXIN) 500 MG tablet Take 1 tablet (500 mg total) by mouth 2 (two) times daily. 02/17/21   Achille Rich, PA-C  metroNIDAZOLE (FLAGYL) 500 MG tablet Take 1 tablet (500 mg total) by mouth 2 (two) times daily. 08/09/21   Fayrene Helper, PA-C  ondansetron (ZOFRAN-ODT) 4 MG disintegrating tablet Take 1 tablet (4 mg total) by mouth every 8 (eight) hours as needed for nausea. 03/30/22   Garlon Hatchet, PA-C  cetirizine (ZYRTEC) 10 MG tablet Take 1 tablet (10 mg total) by mouth daily. 03/16/18 12/22/19  Dahlia Byes A, NP  fluticasone (FLONASE) 50 MCG/ACT nasal spray Place 1-2 sprays into both nostrils daily for 7 days. 05/18/18 12/22/19  Wieters, Junius Creamer, PA-C    Family History Family History  Problem Relation Age of Onset   Cancer Maternal Aunt        breast   Cancer Paternal Aunt        lung   Cancer Maternal Aunt        breast   Cancer Maternal Aunt        ovarian   Cancer Maternal Aunt        ovarian    Social History Social History   Tobacco Use   Smoking status: Some Days    Current packs/day: 0.00    Types: Cigarettes   Smokeless tobacco: Never   Tobacco comments:  6 cig./day  Vaping Use   Vaping status: Never Used  Substance Use Topics   Alcohol use: Yes    Comment: occasionally   Drug use: No     Allergies   Latex   Review of Systems Review of Systems  Constitutional:  Negative for chills and fever.  Eyes:  Negative for discharge and redness.  Gastrointestinal:  Negative for abdominal pain, nausea and vomiting.  Genitourinary:  Positive for menstrual problem.     Physical Exam Triage Vital Signs ED Triage Vitals  Encounter Vitals Group     BP 09/25/22 1758 105/72     Systolic BP Percentile --      Diastolic BP Percentile --      Pulse Rate 09/25/22 1758 70     Resp 09/25/22 1758 16     Temp 09/25/22 1758 98.1 F (36.7 C)     Temp Source 09/25/22 1758 Oral     SpO2 09/25/22 1758 98 %     Weight 09/25/22 1757 170 lb (77.1 kg)      Height 09/25/22 1757 5\' 7"  (1.702 m)     Head Circumference --      Peak Flow --      Pain Score 09/25/22 1757 0     Pain Loc --      Pain Education --      Exclude from Growth Chart --    No data found.  Updated Vital Signs BP 105/72 (BP Location: Left Arm)   Pulse 70   Temp 98.1 F (36.7 C) (Oral)   Resp 16   Ht 5\' 7"  (1.702 m)   Wt 170 lb (77.1 kg)   LMP 09/14/2022 (Exact Date)   SpO2 98%   BMI 26.63 kg/m     Physical Exam Vitals and nursing note reviewed.  Constitutional:      General: She is not in acute distress.    Appearance: Normal appearance. She is not ill-appearing.  HENT:     Head: Normocephalic and atraumatic.  Eyes:     Conjunctiva/sclera: Conjunctivae normal.  Cardiovascular:     Rate and Rhythm: Normal rate.  Pulmonary:     Effort: Pulmonary effort is normal. No respiratory distress.  Neurological:     Mental Status: She is alert.  Psychiatric:        Mood and Affect: Mood normal.        Behavior: Behavior normal.        Thought Content: Thought content normal.      UC Treatments / Results  Labs (all labs ordered are listed, but only abnormal results are displayed) Labs Reviewed  POCT URINE PREGNANCY    EKG   Radiology No results found.  Procedures Procedures (including critical care time)  Medications Ordered in UC Medications - No data to display  Initial Impression / Assessment and Plan / UC Course  I have reviewed the triage vital signs and the nursing notes.  Pertinent labs & imaging results that were available during my care of the patient were reviewed by me and considered in my medical decision making (see chart for details).    Pregnancy screening negative.  Recommended retesting in 2 weeks if she does not start period in the meantime. Encourage follow up with any further concerns.  Final Clinical Impressions(s) / UC Diagnoses   Final diagnoses:  Pregnancy test negative  Abnormal bleeding in menstrual cycle    Discharge Instructions   None    ED Prescriptions   None  PDMP not reviewed this encounter.   Tomi Bamberger, PA-C 09/25/22 Windell Moment

## 2022-09-25 NOTE — ED Triage Notes (Signed)
"  I wanted to confirm pregnancy". No @ home test as of yet. No current symptoms.

## 2022-10-10 ENCOUNTER — Other Ambulatory Visit: Payer: Self-pay

## 2022-10-10 ENCOUNTER — Emergency Department (HOSPITAL_COMMUNITY)
Admission: EM | Admit: 2022-10-10 | Discharge: 2022-10-10 | Disposition: A | Discharge status: Home / Self Care | Payer: 59 | Attending: Emergency Medicine | Admitting: Emergency Medicine

## 2022-10-10 ENCOUNTER — Emergency Department (HOSPITAL_COMMUNITY): Payer: 59

## 2022-10-10 DIAGNOSIS — W231XXA Caught, crushed, jammed, or pinched between stationary objects, initial encounter: Secondary | ICD-10-CM | POA: Insufficient documentation

## 2022-10-10 DIAGNOSIS — Z91040 Latex allergy status: Secondary | ICD-10-CM | POA: Diagnosis not present

## 2022-10-10 DIAGNOSIS — S8002XA Contusion of left knee, initial encounter: Secondary | ICD-10-CM | POA: Insufficient documentation

## 2022-10-10 DIAGNOSIS — S86912A Strain of unspecified muscle(s) and tendon(s) at lower leg level, left leg, initial encounter: Secondary | ICD-10-CM | POA: Diagnosis not present

## 2022-10-10 DIAGNOSIS — Y990 Civilian activity done for income or pay: Secondary | ICD-10-CM | POA: Diagnosis not present

## 2022-10-10 DIAGNOSIS — S8992XA Unspecified injury of left lower leg, initial encounter: Secondary | ICD-10-CM | POA: Diagnosis present

## 2022-10-10 MED ORDER — IBUPROFEN 800 MG PO TABS
800.0000 mg | ORAL_TABLET | Freq: Once | ORAL | Status: AC
  Administered 2022-10-10: 800 mg via ORAL
  Filled 2022-10-10: qty 1

## 2022-10-10 NOTE — Discharge Instructions (Signed)
Use crutches, weight bear as tolerated. Apply ice to knee for 20 minutes at a time. Elevate the leg to help with pain and swelling. Take Motrin and Tylenol for pain as needed as directed.  Recheck with your workers comp provider in 2 days.

## 2022-10-10 NOTE — ED Triage Notes (Signed)
Patient reports pain at left knee radiating to left thigh with mild swelling injured at work this evening , left leg pinned between pallet jack and pole .

## 2022-10-10 NOTE — ED Provider Notes (Signed)
[place] EMERGENCY DEPARTMENT AT [facility] Provider Note   CSN: [id number] Arrival date & time: 10/10/22  0010     History  Chief Complaint  Patient presents with   Knee Injury    Nicole Rose is a 32 y.o. female.  32 year old female presents with left injury which occurred at work tonight. Patient was operating a pallet jack with the handle malfunctioned and her knee was briefly pinned between the jack and a pole. Reports pain and swelling with bruising to the knee. No other injuries.        Home Medications Prior to Admission medications   Medication Sig Start Date End Date Taking? Authorizing Provider  ibuprofen (ADVIL) 600 MG tablet Take 1 tablet (600 mg total) by mouth every 6 (six) hours as needed. 02/17/21   Achille Rich, PA-C  methocarbamol (ROBAXIN) 500 MG tablet Take 1 tablet (500 mg total) by mouth 2 (two) times daily. 02/17/21   Achille Rich, PA-C  metroNIDAZOLE (FLAGYL) 500 MG tablet Take 1 tablet (500 mg total) by mouth 2 (two) times daily. 08/09/21   Fayrene Helper, PA-C  ondansetron (ZOFRAN-ODT) 4 MG disintegrating tablet Take 1 tablet (4 mg total) by mouth every 8 (eight) hours as needed for nausea. 03/30/22   Garlon Hatchet, PA-C  cetirizine (ZYRTEC) 10 MG tablet Take 1 tablet (10 mg total) by mouth daily. 03/16/18 12/22/19  Dahlia Byes A, NP  fluticasone (FLONASE) 50 MCG/ACT nasal spray Place 1-2 sprays into both nostrils daily for 7 days. 05/18/18 12/22/19  Wieters, Hallie C, PA-C      Allergies    Latex    Review of Systems   Review of Systems Negative except as per HPI Physical Exam Updated Vital Signs BP 113/83   Pulse 72   Temp 97.9 F (36.6 C)   Resp 17   LMP 09/14/2022 (Exact Date)   SpO2 100%  Physical Exam Vitals and nursing note reviewed.  Constitutional:      General: She is not in acute distress.    Appearance: She is well-developed. She is not diaphoretic.  HENT:     Head: Normocephalic and atraumatic.   Cardiovascular:     Pulses: Normal pulses.  Pulmonary:     Effort: Pulmonary effort is normal.  Musculoskeletal:        General: Swelling and tenderness present. No deformity.     Left ankle: Normal.       Legs:     Comments: Bruising with mild swelling to the anterior left knee, pt is able to flex the knee to 90 degrees. Compartments are soft, sensation intact, DP pulse present, leg is warm to the touch, pain is not out of proportion   Skin:    General: Skin is warm and dry.     Findings: Bruising present. No erythema or rash.  Neurological:     Mental Status: She is alert and oriented to person, place, and time.     Sensory: No sensory deficit.     Motor: No weakness.  Psychiatric:        Behavior: Behavior normal.     ED Results / Procedures / Treatments   Labs (all labs ordered are listed, but only abnormal results are displayed) Labs Reviewed - No data to display  EKG None  Radiology DG Knee Complete 4 Views Left  Result Date: 10/10/2022 CLINICAL DATA:  Injury, pain EXAM: LEFT KNEE - COMPLETE 4+ VIEW COMPARISON:  None Available. FINDINGS: No evidence of  fracture, dislocation, or joint effusion. No evidence of arthropathy or other focal bone abnormality. Soft tissues are unremarkable. IMPRESSION: Negative. Electronically Signed   By: Charlett Nose M.D.   On: 10/10/2022 01:18    Procedures Procedures    Medications Ordered in ED Medications  ibuprofen (ADVIL) tablet 800 mg (800 mg Oral Given 10/10/22 0201)    ED Course/ Medical Decision Making/ A&P                                 Medical Decision Making Amount and/or Complexity of Data Reviewed Radiology: ordered.  Risk Prescription drug management.   32 year old female presents with left knee injury which occurred at work tonight as above.  She is found of bruising and swelling to the anterior left knee, no crepitus, no obvious laxity, skin is intact, compartments are soft, do not suspect compartment  syndrome.  X-ray of left knee as ordered per myself is negative for acute bony abnormality, agree with radiologist interpretation.  Plan is to discharge with crutches to weight-bear as tolerated, can apply ice and elevate for 20 minutes at a time, take Motrin Tylenol.  Recheck with Worker's Comp. provider in 2 days.        Final Clinical Impression(s) / ED Diagnoses Final diagnoses:  Strain of left knee, initial encounter  Contusion of left knee, initial encounter    Rx / DC Orders ED Discharge Orders     None         Jeannie Fend, PA-C 10/10/22 0229    Gilda Crease, MD 10/10/22 [phone]

## 2022-11-14 ENCOUNTER — Emergency Department (HOSPITAL_COMMUNITY)
Admission: EM | Admit: 2022-11-14 | Discharge: 2022-11-14 | Disposition: A | Discharge status: Home / Self Care | Payer: 59 | Attending: Emergency Medicine | Admitting: Emergency Medicine

## 2022-11-14 ENCOUNTER — Emergency Department (HOSPITAL_COMMUNITY): Payer: 59

## 2022-11-14 ENCOUNTER — Other Ambulatory Visit: Payer: Self-pay

## 2022-11-14 DIAGNOSIS — W228XXA Striking against or struck by other objects, initial encounter: Secondary | ICD-10-CM | POA: Insufficient documentation

## 2022-11-14 DIAGNOSIS — S0003XA Contusion of scalp, initial encounter: Secondary | ICD-10-CM | POA: Diagnosis not present

## 2022-11-14 DIAGNOSIS — S0990XA Unspecified injury of head, initial encounter: Secondary | ICD-10-CM

## 2022-11-14 DIAGNOSIS — Z91040 Latex allergy status: Secondary | ICD-10-CM | POA: Insufficient documentation

## 2022-11-14 MED ORDER — ACETAMINOPHEN 500 MG PO TABS
1000.0000 mg | ORAL_TABLET | Freq: Once | ORAL | Status: AC
  Administered 2022-11-14: 1000 mg via ORAL
  Filled 2022-11-14: qty 2

## 2022-11-14 NOTE — ED Triage Notes (Signed)
Patient arrived with EMS from work , stood up and hit her head against a steel beam while moving boxes with brief LOC , reports dizziness/lightheadedness and photophobia . Alert and oriented x4 at arrival . C-collar applied by EMS .

## 2022-11-14 NOTE — ED Provider Notes (Signed)
[place] EMERGENCY DEPARTMENT AT [facility] Provider Note   CSN: [id number] Arrival date & time: 11/14/22  1936     History  Chief Complaint  Patient presents with   Head Injury    Nicole Rose is a 32 y.o. female.  HPI 32 year old female with no significant past medical history presenting for evaluation of head injury.  Patient states that she was at work moving boxes.  She bent under a steel beam, but then accidentally lifted her head too quickly, striking the top of her head.  She thinks he lost consciousness for several seconds.  She was also "seeing black spots in my vision."  Patient states that her vision changes have resolved, but she continues to have headache and mild dizziness.  Denies any other injuries.  No chest pain, shortness of breath, palpitations, abdominal pain.    Home Medications Prior to Admission medications   Medication Sig Start Date End Date Taking? Authorizing Provider  ibuprofen (ADVIL) 600 MG tablet Take 1 tablet (600 mg total) by mouth every 6 (six) hours as needed. 02/17/21   Achille Rich, PA-C  methocarbamol (ROBAXIN) 500 MG tablet Take 1 tablet (500 mg total) by mouth 2 (two) times daily. 02/17/21   Achille Rich, PA-C  metroNIDAZOLE (FLAGYL) 500 MG tablet Take 1 tablet (500 mg total) by mouth 2 (two) times daily. 08/09/21   Fayrene Helper, PA-C  ondansetron (ZOFRAN-ODT) 4 MG disintegrating tablet Take 1 tablet (4 mg total) by mouth every 8 (eight) hours as needed for nausea. 03/30/22   Garlon Hatchet, PA-C  cetirizine (ZYRTEC) 10 MG tablet Take 1 tablet (10 mg total) by mouth daily. 03/16/18 12/22/19  Dahlia Byes A, NP  fluticasone (FLONASE) 50 MCG/ACT nasal spray Place 1-2 sprays into both nostrils daily for 7 days. 05/18/18 12/22/19  Wieters, Hallie C, PA-C      Allergies    Latex    Review of Systems   Review of Systems  Physical Exam Updated Vital Signs BP 105/69 (BP Location: Right Arm)   Pulse 62   Temp 98.4 F (36.9 C)  (Oral)   Resp 18   SpO2 100%  Physical Exam HENT:     Head: Normocephalic.     Comments: Small hematoma to right frontal scalp. No palpable skull fracture, postauricular bruising    Right Ear: External ear normal.     Left Ear: External ear normal.     Nose: Nose normal.     Mouth/Throat:     Mouth: Mucous membranes are moist.     Pharynx: Oropharynx is clear.  Eyes:     Extraocular Movements: Extraocular movements intact.     Conjunctiva/sclera: Conjunctivae normal.     Pupils: Pupils are equal, round, and reactive to light.  Neck:     Comments: C-collar in place Cardiovascular:     Rate and Rhythm: Normal rate and regular rhythm.     Pulses: Normal pulses.  Pulmonary:     Effort: Pulmonary effort is normal. No respiratory distress.     Breath sounds: No wheezing.  Abdominal:     General: Abdomen is flat.     Palpations: Abdomen is soft.     Tenderness: There is no abdominal tenderness.  Musculoskeletal:        General: No signs of injury. Normal range of motion.     Cervical back: No rigidity.     Right lower leg: No edema.     Left lower leg: No edema.  Skin:    General: Skin is warm and dry.     Findings: No rash.  Neurological:     General: No focal deficit present.     Mental Status: She is alert.     Cranial Nerves: No cranial nerve deficit.     Sensory: No sensory deficit.     Motor: No weakness.     ED Results / Procedures / Treatments   Labs (all labs ordered are listed, but only abnormal results are displayed) Labs Reviewed  POC URINE PREG, ED    EKG None  Radiology CT Head Wo Contrast  Result Date: 11/14/2022 CLINICAL DATA:  Headache, sudden, severe. EXAM: CT HEAD WITHOUT CONTRAST TECHNIQUE: Contiguous axial images were obtained from the base of the skull through the vertex without intravenous contrast. RADIATION DOSE REDUCTION: This exam was performed according to the departmental dose-optimization program which includes automated exposure  control, adjustment of the mA and/or kV according to patient size and/or use of iterative reconstruction technique. COMPARISON:  Head CT 02/17/2021. FINDINGS: Brain: No acute intracranial hemorrhage. Gray-white differentiation is preserved. No hydrocephalus or extra-axial collection. No mass effect or midline shift. Vascular: No hyperdense vessel or unexpected calcification. Skull: No calvarial fracture or suspicious bone lesion. Skull base is unremarkable. Sinuses/Orbits: No acute finding. Other: None. IMPRESSION: No acute intracranial abnormality. Electronically Signed   By: Orvan Falconer M.D.   On: 11/14/2022 21:56    Procedures Procedures    Medications Ordered in ED Medications  acetaminophen (TYLENOL) tablet 1,000 mg (1,000 mg Oral Given 11/14/22 2135)    ED Course/ Medical Decision Making/ A&P      NEXUS Criteria for C-Spine Imaging from [email or web address]  on 11/14/2022 ** All calculations should be rechecked by clinician prior to use **  RESULT SUMMARY:     If none of the above criteria are present, the C-Spine can be cleared clinically by these criteria.  Imaging is not required.   INPUTS: Focal neurologic deficit present --> 0 = No Midline spinal tenderness present --> 0 = No Altered level of consciousness present --> 0 = No Intoxication present --> 0 = No Distracting injury present --> 0 = No                               Medical Decision Making Amount and/or Complexity of Data Reviewed Radiology: ordered.  Risk OTC drugs.   32 year old female with past medical history of HPI as above.  Vital signs stable upon arrival.  Patient is nontoxic-appearing.  She has mild hematoma to her right frontal scalp, but no other signs of traumatic injury.  Neurologic exam is unremarkable.  Head CT obtained due to severity of mechanism to cause loss of consciousness.  This was reviewed.  No obvious skull fracture or intracranial bleed seen.  Agree with radiology read.  Based on Nexus  C-spine criteria, did not feel that CT imaging of the cervical spine was warranted at this time.  No other traumatic injuries.  Has been ambulatory since the incident.  As patient's injury was purely mechanical, do not feel that lab work is necessary.  She had no preceding headache, dizziness, chest pain, shortness of breath, palpitations and has remained asymptomatic from this standpoint. Patient denied cervical spine tenderness, therefore collar removed. Patient denied cervical spine pain, dizziness, or blurriness with active or passive range of motion in all directions. Cervical collar cleared.  Patient is safe for discharge home at  this time.  She will be sent home with educational material regarding concussion. Strict return precautions provided.     Final Clinical Impression(s) / ED Diagnoses Final diagnoses:  Injury of head, initial encounter    Rx / DC Orders ED Discharge Orders     None         Lyman Speller, MD 11/14/22 2841    Alvira Monday, MD 11/16/22 1419

## 2022-11-14 NOTE — Discharge Instructions (Addendum)
Nicole Rose:  Thank you for allowing [place] to take care of you today.  We hope you begin feeling better soon. You were seen today for a head injury.  To-Do: Please follow-up with your primary doctor to schedule an appointment with a new primary care doctor within the next 2-3 days.  A referral has been placed to schedule this appointment. Read above educational materials regarding concussion and head injury.  Monitor your symptoms closely with gradual return to activity  Please return to the Emergency Department or call 911 if you experience worsening headache, dizziness, vision changes, recurrent nausea or vomiting, chest pain, shortness of breath, severe pain, severe fever, altered mental status, or have any reason to think that you need emergency medical care.  Thank you again.  Hope you feel better soon.

## 2022-11-14 NOTE — ED Notes (Signed)
IV removed at 23:04

## 2023-05-23 ENCOUNTER — Ambulatory Visit
Admission: EM | Admit: 2023-05-23 | Discharge: 2023-05-23 | Disposition: A | Discharge status: Home / Self Care | Attending: Internal Medicine | Admitting: Internal Medicine

## 2023-05-23 ENCOUNTER — Encounter: Payer: Self-pay | Admitting: Emergency Medicine

## 2023-05-23 ENCOUNTER — Other Ambulatory Visit: Payer: Self-pay

## 2023-05-23 DIAGNOSIS — J4521 Mild intermittent asthma with (acute) exacerbation: Secondary | ICD-10-CM | POA: Diagnosis not present

## 2023-05-23 HISTORY — DX: Unspecified asthma, uncomplicated: J45.909

## 2023-05-23 HISTORY — DX: Unspecified chronic bronchitis: J42

## 2023-05-23 MED ORDER — ALBUTEROL SULFATE (2.5 MG/3ML) 0.083% IN NEBU
2.5000 mg | INHALATION_SOLUTION | Freq: Once | RESPIRATORY_TRACT | Status: AC
  Administered 2023-05-23: 2.5 mg via RESPIRATORY_TRACT

## 2023-05-23 MED ORDER — ALBUTEROL SULFATE (2.5 MG/3ML) 0.083% IN NEBU: 2.5000 mg | INHALATION_SOLUTION | Freq: Four times a day (QID) | RESPIRATORY_TRACT | 0 refills | Status: AC | PRN

## 2023-05-23 MED ORDER — ALBUTEROL SULFATE HFA 108 (90 BASE) MCG/ACT IN AERS: 1.0000 | INHALATION_SPRAY | Freq: Four times a day (QID) | RESPIRATORY_TRACT | 0 refills | Status: AC | PRN

## 2023-05-23 MED ORDER — PREDNISONE 20 MG PO TABS: 40.0000 mg | ORAL_TABLET | Freq: Every day | ORAL | 0 refills | Status: AC

## 2023-05-23 NOTE — ED Provider Notes (Addendum)
 [facility]    CSN: [id number] Arrival date & time: 05/23/23  1136      History   Chief Complaint Chief Complaint  Patient presents with   Shortness of Breath    HPI Nicole Rose is a 33 y.o. female.   Patient presents with shortness of breath that started last night.  Reports that she is also concerned for seasonal allergies given she has been having itchy, watery eyes and nasal congestion.  States that she does have a history of asthma which typically flares up with season changes.  She does not have any albuterol inhaler or nebulizer treatments at home to use.  Denies fever, body aches, chills.  Denies any known sick contacts.  She does not report any medications for symptoms.  Patient states that she is concerned given that she has had an asthma exacerbation in the past that caused a seizure.  Has not had follow-up for this.   Shortness of Breath   Past Medical History:  Diagnosis Date   Anemia    no current med.   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      Home Medications    Prior to Admission medications   Medication Sig Start Date End Date Taking? Authorizing Provider  albuterol (PROVENTIL) (2.5 MG/3ML) 0.083% nebulizer solution Take 3 mLs (2.5 mg total) by nebulization every 6 (six) hours as needed for wheezing or shortness of breath. 05/23/23  Yes Arlind Klingerman, Acie Fredrickson, FNP  albuterol (VENTOLIN HFA) 108 (90 Base) MCG/ACT inhaler Inhale 1-2 puffs into the lungs every 6 (six) hours as needed for wheezing or shortness of breath. 05/23/23  Yes Ermalee Mealy, Rolly Salter E, FNP  ibuprofen (ADVIL) 600 MG tablet Take 1 tablet (600 mg total) by mouth every 6 (six) hours as needed. 02/17/21  Yes Achille Rich, PA-C  predniSONE (DELTASONE) 20 MG tablet Take 2 tablets (40 mg total) by mouth daily for 5 days. 05/23/23 05/28/23 Yes Graysin Luczynski, Acie Fredrickson, FNP  methocarbamol (ROBAXIN) 500 MG tablet Take 1 tablet (500 mg total) by mouth 2 (two) times daily. Patient not taking: Reported on 05/23/2023 02/17/21   Achille Rich, PA-C  metroNIDAZOLE (FLAGYL) 500 MG tablet Take 1 tablet (500 mg total) by mouth 2 (two) times daily. Patient not taking: Reported on 05/23/2023 08/09/21   Fayrene Helper, PA-C  ondansetron (ZOFRAN-ODT) 4 MG disintegrating tablet Take 1 tablet (4 mg total) by mouth  every 8 (eight) hours as needed for nausea. Patient not taking: Reported on 05/23/2023 03/30/22   Garlon Hatchet, PA-C  cetirizine (ZYRTEC) 10 MG tablet Take 1 tablet (10 mg total) by mouth daily. 03/16/18 12/22/19  Janace Aris, FNP  fluticasone (FLONASE) 50 MCG/ACT nasal spray Place 1-2 sprays into both nostrils daily for 7 days. 05/18/18 12/22/19  Wieters, Junius Creamer, PA-C    Family History Family History  Problem Relation Age of Onset   Cancer Maternal Aunt        breast   Cancer Paternal Aunt        lung   Cancer Maternal Aunt        breast   Cancer Maternal Aunt        ovarian    Cancer Maternal Aunt        ovarian    Social History Social History   Tobacco Use   Smoking status: Some Days    Current packs/day: 0.00    Types: Cigarettes    Passive exposure: Current   Smokeless tobacco: Never   Tobacco comments:    6 cig./day  Vaping Use   Vaping status: Never Used  Substance Use Topics   Alcohol use: Yes    Comment: occasionally   Drug use: No     Allergies   Latex   Review of Systems Review of Systems Per HPI  Physical Exam Triage Vital Signs ED Triage Vitals  Encounter Vitals Group     BP 05/23/23 1159 111/75     Systolic BP Percentile --      Diastolic BP Percentile --      Pulse Rate 05/23/23 1159 78     Resp 05/23/23 1159 18     Temp 05/23/23 1159 98 F (36.7 C)     Temp Source 05/23/23 1159 Oral     SpO2 05/23/23 1159 97 %     Weight --      Height --      Head Circumference --      Peak Flow --      Pain Score 05/23/23 1200 0     Pain Loc --      Pain Education --      Exclude from Growth Chart --    No data found.  Updated Vital Signs BP 111/75 (BP Location: Right Arm)   Pulse 91   Temp 98 F (36.7 C) (Oral)   Resp 18   LMP 05/12/2023 (Approximate)   SpO2 99% Comment: post neb txt  Visual Acuity Right Eye Distance:   Left Eye Distance:   Bilateral Distance:    Right Eye Near:   Left Eye Near:    Bilateral Near:     Physical Exam Constitutional:      General: She is not in acute distress.    Appearance: Normal appearance. She is not toxic-appearing or diaphoretic.  HENT:     Head: Normocephalic.     Right Ear: Tympanic membrane and ear canal normal.     Left Ear: Tympanic membrane and ear canal normal.     Nose: Congestion present.     Mouth/Throat:     Mouth: Mucous membranes are moist.     Pharynx: No posterior oropharyngeal erythema.  Eyes:     Extraocular Movements: Extraocular movements intact.     Conjunctiva/sclera: Conjunctivae normal.     Pupils: Pupils are equal, round, and reactive to  light.  Cardiovascular:     Rate and Rhythm: Normal  rate and regular rhythm.     Pulses: Normal pulses.     Heart sounds: Normal heart sounds.  Pulmonary:     Effort: Pulmonary effort is normal. No respiratory distress.     Breath sounds: Normal breath sounds. No stridor. No wheezing, rhonchi or rales.  Musculoskeletal:     Cervical back: Normal range of motion.  Skin:    General: Skin is warm and dry.  Neurological:     General: No focal deficit present.     Mental Status: She is alert and oriented to person, place, and time.  Psychiatric:        Mood and Affect: Mood normal.        Behavior: Behavior normal.      UC Treatments / Results  Labs (all labs ordered are listed, but only abnormal results are displayed) Labs Reviewed - No data to display  EKG   Radiology No results found.  Procedures Procedures (including critical care time)  Medications Ordered in UC Medications  albuterol (PROVENTIL) (2.5 MG/3ML) 0.083% nebulizer solution 2.5 mg (2.5 mg Nebulization Given 05/23/23 1213)    Initial Impression / Assessment and Plan / UC Course  I have reviewed the triage vital signs and the nursing notes.  Pertinent labs & imaging results that were available during my care of the patient were reviewed by me and considered in my medical decision making (see chart for details).     Suspect mild asthma exacerbation.  Oxygen is normal and there is no tachypnea so do not think that chest imaging or emergent evaluation is necessary.  Patient is safe for discharge.  Albuterol nebulizer treatment administered with improvement in patient's symptoms.  Will treat with prednisone.  Patient was sent home with albuterol nebulizer machine.  Will send albuterol inhaler and albuterol nebulizer medication but the patient was educated that this is the same medication and to use at least 4 to 6 hours apart if in the same day.  Patient stated that asthma flareups typically cause seizures so  encouraged her to follow-up with neurology and PCP.  Patient denies any recent seizures.  Contact information for neurology was given to patient.  PCP appointment was made for patient in 2 weeks by clinical staff.  Advised strict ER precautions.  Patient verbalized understanding and was agreeable with plan. Final Clinical Impressions(s) / UC Diagnoses   Final diagnoses:  Mild intermittent asthma with acute exacerbation     Discharge Instructions      I have sent you prednisone to help with asthma flareup.  I have also given you an inhaler and nebulizer medication.  Follow-up if any symptoms persist or worsen.     ED Prescriptions     Medication Sig Dispense Auth. Provider   predniSONE (DELTASONE) 20 MG tablet Take 2 tablets (40 mg total) by mouth daily for 5 days. 10 tablet [place], Rolly Salter E, [place]   albuterol (VENTOLIN HFA) 108 (90 Base) MCG/ACT inhaler Inhale 1-2 puffs into the lungs every 6 (six) hours as needed for wheezing or shortness of breath. 1 each [place], Rolly Salter E, FNP   albuterol (PROVENTIL) (2.5 MG/3ML) 0.083% nebulizer solution Take 3 mLs (2.5 mg total) by nebulization every 6 (six) hours as needed for wheezing or shortness of breath. 75 mL Gustavus Bryant, [place]      PDMP not reviewed this encounter.   Gustavus Bryant, [place] 05/23/23 1248    [address] [place] 05/23/23 1249

## 2023-05-23 NOTE — ED Triage Notes (Signed)
 Pt reports intermittent episodes of shortness of breath that started last night. Pt suspects association with seasonal allergies as she has itchy, watery eyes and itchy throat. Pt reports chest tightness and pressure when trying to take deep breaths.  Pt reports hx of chronic bronchitis and asthma. She currently has no PCP and no prescriptions for inhalers or nebulizer treatments. States in Oct 2024 she was hospitalized from syncope episode related to asthma. No current distress. Regular, unlabored breathing. Pulse ox: 96% on RA

## 2023-05-23 NOTE — Discharge Instructions (Signed)
 I have sent you prednisone to help with asthma flareup.  I have also given you an inhaler and nebulizer medication.  Follow-up if any symptoms persist or worsen.

## 2023-06-03 ENCOUNTER — Ambulatory Visit (INDEPENDENT_AMBULATORY_CARE_PROVIDER_SITE_OTHER): Admitting: Family

## 2023-06-03 ENCOUNTER — Encounter: Payer: Self-pay | Admitting: Family

## 2023-06-03 VITALS — BP 118/78 | HR 74 | Temp 97.5°F | Ht 68.0 in | Wt 179.2 lb

## 2023-06-03 DIAGNOSIS — Z01419 Encounter for gynecological examination (general) (routine) without abnormal findings: Secondary | ICD-10-CM | POA: Diagnosis not present

## 2023-06-03 DIAGNOSIS — R569 Unspecified convulsions: Secondary | ICD-10-CM | POA: Diagnosis not present

## 2023-06-03 DIAGNOSIS — J4520 Mild intermittent asthma, uncomplicated: Secondary | ICD-10-CM | POA: Diagnosis not present

## 2023-06-03 DIAGNOSIS — F17218 Nicotine dependence, cigarettes, with other nicotine-induced disorders: Secondary | ICD-10-CM

## 2023-06-03 NOTE — Assessment & Plan Note (Signed)
 Symptoms exacerbated by weather changes and pollen. Smoking contributes to asthma exacerbations. Smoking cessation advised to prevent complications. - Encourage smoking cessation and offer support or medication. - Continue albuterol inhaler as needed. - Consider further asthma management if symptoms persist. - F/U prn

## 2023-06-03 NOTE — Patient Instructions (Signed)
 Welcome to [facility], It was a pleasure meeting you today!   I have sent a referral to our gynecology office.   Please schedule a physical with fasting labs today.    PLEASE NOTE: If you had any LAB tests please let us  know if you have not heard back within a few days. You may see your results on MyChart before we have a chance to review them but we will give you a call once they are reviewed by us . If we ordered any REFERRALS today, please let us  know if you have not heard from their office within the next week.  Let us  know through MyChart if you are needing REFILLS, or have your pharmacy send us  the request. You can also use MyChart to communicate with me or any office staff.  Please try these tips to maintain a healthy lifestyle: It is important that you exercise regularly at least 30 minutes 5 times a week. Think about what you will eat, plan ahead. Choose whole foods, & think  "clean, green, fresh or frozen" over canned, processed or packaged foods which are more sugary, salty, and fatty. 70 to 75% of food eaten should be fresh vegetables and protein. 2-3  meals daily with healthy snacks between meals, but must be whole fruit, protein or vegetables. Aim to eat over a 10 hour period when you are active, for example, 7am to 5pm, and then STOP after your last meal of the day, drinking only water.  Shorter eating windows, 6-8 hours, are showing benefits in heart disease and blood sugar regulation. Drink water every day! Shoot for 64 ounces daily = 8 cups, no other drink is as healthy! Fruit juice is best enjoyed in a healthy way, by EATING the fruit.

## 2023-06-03 NOTE — Assessment & Plan Note (Deleted)
 Last one 10/2022, seen in ER. Episodes of whole-body shaking and fainting, sometimes resulting in injury. Differential includes seizure disorder, syncope, or anxiety-related episodes. Further evaluation needed before neurology referral. - Schedule follow-up for physical examination and lab tests. - Consider neurology referral after obtaining more information reviewing chart history.

## 2023-06-03 NOTE — Assessment & Plan Note (Signed)
 Reports 4-5 cigarettes daily and while working. States other employees smoke also (outside). Advised pt on increased risk of harm to health including lung cancer, respiratory illnesses, MI or stroke. - Advised on complete smoking cessation, offered support resources. - Pt to consider if medication needed to control cravings.

## 2023-06-03 NOTE — Assessment & Plan Note (Signed)
 Last one 10/2022, seen in ER. Episodes of whole-body shaking and fainting, sometimes resulting in injury. Differential includes seizure disorder, syncope, or anxiety-related episodes. Further evaluation needed before neurology referral. - Schedule follow-up for physical examination and lab tests. - Consider neurology referral after obtaining more information reviewing chart history.

## 2023-06-03 NOTE — Progress Notes (Signed)
 New Patient Office Visit  Subjective:  Patient ID: Nicole Rose, female    DOB: [birth year]  Age: 33 y.o. MRN: [id number]  CC:  Chief Complaint  Patient presents with   New Patient (Initial Visit)   Seizures    Pt c/o seizures, Present for 4 years. Pt states she has had head injuries after having seizures which caused Headaches. Has tried midol which does help relieve pressure.    Gynecologic Exam    Pt would like a referral to OBGYN.    HPI Nicole Rose presents for establishing care today.  Discussed the use of AI scribe software for clinical note transcription with the patient, who gave verbal consent to proceed.  History of Present Illness The patient, with a history of asthma, presents for evaluation of recurrent seizures. The first episode occurred approximately four years ago at work, characterized by a sharp, stabbing chest pain, followed by fainting. The patient was taken to the ER, where she was noted to have significant shaking and referred to a Neurologist but around the same time her mother passed away and she was unable to follow up. Since then, the patient has had a few similar episodes, always at work, characterized by a sensation of whole-body shaking, followed by loss of consciousness. The most recent episode occurred in November or December, during which the patient fell and hit her head on steel rods. The patient also reports a history of anxiety, which she feels may contribute to her symptoms. She denies any similar episodes at home or elsewhere. The patient also reports a history of asthma, with a current exacerbation that she is finishing a round of prednisone for her sx, typically triggered by changes in weather and is currently managed with an albuterol inhaler. The patient is also a current smoker, consuming approximately four to five cigarettes per day.  Assessment & Plan Possible seizure disorder Last one she thinks was 10/2022, seen in ER after hitting  her head and passing out. First one in 01/2021 also causing a fall, syncopal episode & head injury. Head imaging negative both times and no mention in either ER note regarding seizure-like activity. She reports episodes of whole-body shaking and fainting. Differential includes seizure disorder, syncope, or anxiety-related episodes. Further evaluation needed before neurology referral. - Schedule follow-up for physical examination and lab tests. - Consider neurology referral after obtaining more information reviewing chart history.  Asthma Symptoms exacerbated by weather changes and pollen. Smoking contributes to asthma exacerbations. Smoking cessation advised to prevent complications. - Encourage smoking cessation and offer support or medication. - Continue albuterol inhaler as needed. - Consider further asthma management if symptoms persist.  Nicotine addiction - Reports 4-5 cigarettes daily and while working. States other employees smoke also (outside). Advised pt on increased risk of harm to health including lung cancer, respiratory illnesses, MI or stroke. - Advised on complete smoking cessation, offered support resources. - Pt to consider if medication needed to control cravings.  General Health Maintenance No recent physical exam or labs. Night shifts affect sleep and health, reporting only 2-3h sleep most days. Routine health maintenance needed. - Schedule physical exam and lab tests, ensure 8-hour fasting. - Refer to gynecology for Pap smear. - Encourage improved sleep hygiene and consider work shift change.   Subjective:    Outpatient Medications Prior to Visit  Medication Sig Dispense Refill   albuterol (PROVENTIL) (2.5 MG/3ML) 0.083% nebulizer solution Take 3 mLs (2.5 mg total) by nebulization every 6 (six) hours as  needed for wheezing or shortness of breath. 75 mL 0   albuterol (VENTOLIN HFA) 108 (90 Base) MCG/ACT inhaler Inhale 1-2 puffs into the lungs every 6 (six) hours as  needed for wheezing or shortness of breath. 1 each 0   ibuprofen (ADVIL) 600 MG tablet Take 1 tablet (600 mg total) by mouth every 6 (six) hours as needed. (Patient not taking: Reported on 06/03/2023) 30 tablet 0   methocarbamol (ROBAXIN) 500 MG tablet Take 1 tablet (500 mg total) by mouth 2 (two) times daily. (Patient not taking: Reported on 06/03/2023) 20 tablet 0   metroNIDAZOLE (FLAGYL) 500 MG tablet Take 1 tablet (500 mg total) by mouth 2 (two) times daily. (Patient not taking: Reported on 06/03/2023) 14 tablet 0   ondansetron (ZOFRAN-ODT) 4 MG disintegrating tablet Take 1 tablet (4 mg total) by mouth every 8 (eight) hours as needed for nausea. (Patient not taking: Reported on 06/03/2023) 10 tablet 0   No facility-administered medications prior to visit.   Past Medical History:  Diagnosis Date   Anemia    no current med.   Asthma    Chronic bronchitis (HCC)    Metacarpal bone fracture 01/2018   left 4th   Migraines    Obesity    Seizures (HCC)    Past Surgical History:  Procedure Laterality Date   BREAST BIOPSY Right 09/28/2012   Procedure: EXCISION OF CHRONIC RIGHT BREAST ABSCESS;  Surgeon: Lockie Rima, MD;  Location: [facility] OR;  Service: General;  Laterality: Right;   OPEN REDUCTION INTERNAL FIXATION (ORIF) METACARPAL Left 02/06/2018   Procedure: OPEN REDUCTION INTERNAL FIXATION (ORIF) 4TH METACARPAL;  Surgeon: Saundra Curl, MD;  Location: [facility];  Service: Orthopedics;  Laterality: Left;    Objective:   Today's Vitals: BP 118/78 (BP Location: Left Arm, Patient Position: Sitting, Cuff Size: Large)   Pulse 74   Temp (!) 97.5 F (36.4 C) (Temporal)   Ht 5\' 8"  (1.727 m)   Wt 179 lb 3.2 oz (81.3 kg)   LMP 05/12/2023 (Approximate)   SpO2 99%   BMI 27.25 kg/m   Physical Exam Vitals and nursing note reviewed.  Constitutional:      Appearance: Normal appearance.  Cardiovascular:     Rate and Rhythm: Normal rate and regular rhythm.  Pulmonary:     Effort:  Pulmonary effort is normal.     Breath sounds: Normal breath sounds.  Musculoskeletal:        General: Normal range of motion.  Skin:    General: Skin is warm and dry.  Neurological:     Mental Status: She is alert.  Psychiatric:        Mood and Affect: Mood normal.        Behavior: Behavior normal.    No orders of the defined types were placed in this encounter.   Versa Gore, NP

## 2023-06-17 ENCOUNTER — Encounter: Admitting: Family

## 2023-07-02 ENCOUNTER — Other Ambulatory Visit: Payer: Self-pay

## 2023-07-02 ENCOUNTER — Emergency Department (HOSPITAL_COMMUNITY)

## 2023-07-02 ENCOUNTER — Encounter (HOSPITAL_COMMUNITY): Payer: Self-pay

## 2023-07-02 ENCOUNTER — Emergency Department (HOSPITAL_COMMUNITY)
Admission: EM | Admit: 2023-07-02 | Discharge: 2023-07-02 | Disposition: A | Discharge status: Home / Self Care | Payer: Worker's Compensation | Attending: Emergency Medicine | Admitting: Emergency Medicine

## 2023-07-02 DIAGNOSIS — S8992XA Unspecified injury of left lower leg, initial encounter: Secondary | ICD-10-CM | POA: Diagnosis present

## 2023-07-02 DIAGNOSIS — M79662 Pain in left lower leg: Secondary | ICD-10-CM

## 2023-07-02 DIAGNOSIS — S8002XA Contusion of left knee, initial encounter: Secondary | ICD-10-CM | POA: Insufficient documentation

## 2023-07-02 DIAGNOSIS — T148XXA Other injury of unspecified body region, initial encounter: Secondary | ICD-10-CM

## 2023-07-02 DIAGNOSIS — W231XXA Caught, crushed, jammed, or pinched between stationary objects, initial encounter: Secondary | ICD-10-CM | POA: Diagnosis not present

## 2023-07-02 DIAGNOSIS — Z91040 Latex allergy status: Secondary | ICD-10-CM | POA: Diagnosis not present

## 2023-07-02 DIAGNOSIS — Y990 Civilian activity done for income or pay: Secondary | ICD-10-CM | POA: Insufficient documentation

## 2023-07-02 MED ORDER — HYDROCODONE-ACETAMINOPHEN 5-325 MG PO TABS
1.0000 | ORAL_TABLET | Freq: Once | ORAL | Status: AC
  Administered 2023-07-02: 1 via ORAL
  Filled 2023-07-02: qty 1

## 2023-07-02 NOTE — Progress Notes (Signed)
 Left lower extremity venous duplex has been completed. Preliminary results can be found in CV Proc through chart review.  Results were given to [facility] PA.  07/02/23 3:52 PM Birda Buffy RVT

## 2023-07-02 NOTE — Discharge Instructions (Signed)
 It was a pleasure taking care of you here today.  Your x-rays did not show any evidence of broken bones or dislocations.  Your ultrasound did not get evidence of blood clots.  You do have a bruise to the area.  We have placed a brace.  I would make sure to ice, elevate the leg.  Take it easy, follow-up outpatient primary care provider, return for any worsening symptoms

## 2023-07-02 NOTE — ED Triage Notes (Addendum)
 Pt states her left leg got stuck between trailer and jack. No deformities noted. No bleeding noted. C/O knee swelling. Pt able to ambulate

## 2023-07-02 NOTE — ED Provider Notes (Signed)
 [facility] Provider Note   CSN: [id number] Arrival date & time: 07/02/23  1000    History  Chief Complaint  Patient presents with   Leg Injury    Nicole Rose is a 33 y.o. female here for evaluation of left knee injury.  She has pain to the medial aspect of her left knee.  While she was at work yesterday her leg got caught between a trailer and the jack.  She noted some pain to her knee.  She has been able to ambulate however has some pain.  No numbness or weakness.  Has not noted any skin changes.  HPI     Home Medications Prior to Admission medications   Medication Sig Start Date End Date Taking? Authorizing Provider  albuterol  (PROVENTIL ) (2.5 MG/3ML) 0.083% nebulizer solution Take 3 mLs (2.5 mg total) by nebulization every 6 (six) hours as needed for wheezing or shortness of breath. 05/23/23  Yes Mound, Dewey Fordyce, FNP  albuterol  (VENTOLIN  HFA) 108 (90 Base) MCG/ACT inhaler Inhale 1-2 puffs into the lungs every 6 (six) hours as needed for wheezing or shortness of breath. 05/23/23  Yes Mound, Dewey Fordyce, FNP  cetirizine  (ZYRTEC ) 10 MG tablet Take 1 tablet (10 mg total) by mouth daily. 03/16/18 12/22/19  Landa Pine, FNP  fluticasone  (FLONASE ) 50 MCG/ACT nasal spray Place 1-2 sprays into both nostrils daily for 7 days. 05/18/18 12/22/19  Wieters, Hallie C, PA-C      Allergies    Latex    Review of Systems   Review of Systems  Constitutional: Negative.   HENT: Negative.    Respiratory: Negative.    Cardiovascular: Negative.   Gastrointestinal: Negative.   Genitourinary: Negative.   Musculoskeletal:        Leg knee pain  Skin: Negative.   Neurological: Negative.   All other systems reviewed and are negative.   Physical Exam Updated Vital Signs BP 133/80 (BP Location: Left Arm)   Pulse 74   Temp 98.2 F (36.8 C)   Resp 16   Ht 5\' 7"  (1.702 m)   Wt 81.2 kg   LMP 06/30/2023   SpO2 100%   BMI 28.04 kg/m  Physical  Exam Vitals and nursing note reviewed.  Constitutional:      General: She is not in acute distress.    Appearance: She is well-developed. She is not ill-appearing, toxic-appearing or diaphoretic.  HENT:     Head: Atraumatic.  Eyes:     Pupils: Pupils are equal, round, and reactive to light.  Cardiovascular:     Rate and Rhythm: Normal rate.     Pulses: Normal pulses.          Popliteal pulses are 2+ on the left side.       Dorsalis pedis pulses are 2+ on the right side and 2+ on the left side.     Heart sounds: Normal heart sounds.  Pulmonary:     Effort: No respiratory distress.  Abdominal:     General: There is no distension.  Musculoskeletal:        General: Normal range of motion.     Cervical back: Normal range of motion.       Legs:     Comments: Nontender right lower extremity.  Nontender left femur, chest, proximal tib-fib, foot.  Tenderness left medial and posterior medial aspect with overlying bruising to left medial knee.  Negative anterior drawer, varus, no distress.  Compartments soft  Skin:    General: Skin is warm and dry.     Capillary Refill: Capillary refill takes less than 2 seconds.     Comments: Bruise left medial aspect knee.  No obvious joint effusion  Neurological:     General: No focal deficit present.     Mental Status: She is alert.     Comments: Intact sensation Equal strength Ambulatory  Psychiatric:        Mood and Affect: Mood normal.     ED Results / Procedures / Treatments   Labs (all labs ordered are listed, but only abnormal results are displayed) Labs Reviewed - No data to display  EKG None  Radiology VAS US  LOWER EXTREMITY VENOUS (DVT) (ONLY [facility] & [facility]) Result Date: 07/02/2023  Lower Venous DVT Study Patient Name:  Nicole Rose  Date of Exam:   07/02/2023 Medical Rec #: [id number]          Accession #:    [id number] Date of Birth: 02-11-1991         Patient Gender: F Patient Age:   32 years Exam Location:  [facility]  Procedure:      VAS US  LOWER EXTREMITY VENOUS (DVT) Referring Phys: Perris Conwell --------------------------------------------------------------------------------  Indications: Pain.  Risk Factors: None identified. Comparison Study: No prior studies. Performing Technologist: Lerry Ransom RVT  Examination Guidelines: A complete evaluation includes B-mode imaging, spectral Doppler, color Doppler, and power Doppler as needed of all accessible portions of each vessel. Bilateral testing is considered an integral part of a complete examination. Limited examinations for reoccurring indications may be performed as noted. The reflux portion of the exam is performed with the patient in reverse Trendelenburg.  +-----+---------------+---------+-----------+----------+--------------+ RIGHTCompressibilityPhasicitySpontaneityPropertiesThrombus Aging +-----+---------------+---------+-----------+----------+--------------+ CFV  Full           Yes      Yes                                 +-----+---------------+---------+-----------+----------+--------------+   +---------+---------------+---------+-----------+----------+--------------+ LEFT     CompressibilityPhasicitySpontaneityPropertiesThrombus Aging +---------+---------------+---------+-----------+----------+--------------+ CFV      Full           Yes      Yes                                 +---------+---------------+---------+-----------+----------+--------------+ SFJ      Full                                                        +---------+---------------+---------+-----------+----------+--------------+ FV Prox  Full                                                        +---------+---------------+---------+-----------+----------+--------------+ FV Mid   Full                                                        +---------+---------------+---------+-----------+----------+--------------+  FV DistalFull                                                         +---------+---------------+---------+-----------+----------+--------------+ PFV      Full                                                        +---------+---------------+---------+-----------+----------+--------------+ POP      Full           Yes      Yes                                 +---------+---------------+---------+-----------+----------+--------------+ PTV      Full                                                        +---------+---------------+---------+-----------+----------+--------------+ PERO     Full                                                        +---------+---------------+---------+-----------+----------+--------------+     Summary: RIGHT: - No evidence of common femoral vein obstruction.   LEFT: - There is no evidence of deep vein thrombosis in the lower extremity.  - No cystic structure found in the popliteal fossa.  *See table(s) above for measurements and observations. Electronically signed by Jimmye Moulds MD on 07/02/2023 at 4:33:26 PM.    Final    DG Knee Complete 4 Views Left Result Date: 07/02/2023 CLINICAL DATA:  Left knee pain and proximal left lower leg pain after crush injury yesterday, swelling EXAM: LEFT KNEE - COMPLETE 4+ VIEW; LEFT TIBIA AND FIBULA - 2 VIEW COMPARISON:  10/10/2022 FINDINGS: Left knee: Frontal, bilateral oblique, lateral views of the left knee are obtained. No acute fracture, subluxation, or dislocation. Joint spaces are well preserved. No joint effusion. Soft tissues are unremarkable. Left tibia/fibula: Frontal and lateral views are obtained. There are no acute displaced fractures. Alignment of the left knee and ankle is anatomic. Soft tissues are normal. IMPRESSION: 1. Unremarkable left knee and left tibia/fibula. No acute bony abnormalities. Electronically Signed   By: Bobbye Burrow M.D.   On: 07/02/2023 15:39   DG Tibia/Fibula Left Result Date: 07/02/2023 CLINICAL DATA:  Left  knee pain and proximal left lower leg pain after crush injury yesterday, swelling EXAM: LEFT KNEE - COMPLETE 4+ VIEW; LEFT TIBIA AND FIBULA - 2 VIEW COMPARISON:  10/10/2022 FINDINGS: Left knee: Frontal, bilateral oblique, lateral views of the left knee are obtained. No acute fracture, subluxation, or dislocation. Joint spaces are well preserved. No joint effusion. Soft tissues are unremarkable. Left tibia/fibula: Frontal and lateral views are obtained. There are no acute displaced fractures. Alignment of the left knee and ankle is anatomic. Soft tissues are normal.  IMPRESSION: 1. Unremarkable left knee and left tibia/fibula. No acute bony abnormalities. Electronically Signed   By: Bobbye Burrow M.D.   On: 07/02/2023 15:39    Procedures .Ortho Injury Treatment  Date/Time: 07/02/2023 4:39 PM  Performed by: Coleta David A, PA-C Authorized by: Dickson Founds, PA-C   Consent:    Consent obtained:  Verbal   Consent given by:  Patient   Risks discussed:  Fracture, nerve damage, restricted joint movement, vascular damage, recurrent dislocation, irreducible dislocation and stiffness   Alternatives discussed:  Referral, immobilization, alternative treatment, delayed treatment and no treatmentInjury location: knee Location details: left knee Injury type: soft tissue Pre-procedure neurovascular assessment: neurovascularly intact Pre-procedure distal perfusion: normal Pre-procedure neurological function: normal Pre-procedure range of motion: normal  Anesthesia: Local anesthesia used: no  Patient sedated: NoImmobilization: brace Splint type: knee sleeve. Splint Applied by: Elio Gubler Post-procedure neurovascular assessment: post-procedure neurovascularly intact Post-procedure distal perfusion: normal Post-procedure neurological function: normal Post-procedure range of motion: normal       Medications Ordered in ED Medications  HYDROcodone -acetaminophen  (NORCO/VICODIN) 5-325 MG per  tablet 1 tablet (1 tablet Oral Given 07/02/23 1530)    ED Course/ Medical Decision Making/ A&P Clinical Course as of 07/02/23 1639  Wed Jul 02, 2023  1552 Neg for DVT [BH]    Clinical Course User Index [BH] Zona Pedro A, PA-C   33 year old here for evaluation of left knee pain after getting her knee stuck between a trailer hitch.  She is neurovascularly intact.  She has some pain to the medial aspect of her left knee as well as the posterior medial aspect however does have a small bruise to the medial aspect knee.  Her compartments are soft.  No bony tenderness to midshaft, proximal tib-fib, femur.  No obvious joint effusion.  Ambulatory here.  No open wounds.  Will plan on imaging and reassess  Imaging personally viewed and interpreted:  X-ray left knee, tib-fib without fracture, dislocation Ultrasound negative for DVT  Discussed results with patient.  I suspect soft tissue injury.  She was placed in a knee sleeve for comfort.  Discussed RICE for symptomatic management.  Low suspicion for occult fracture, dislocation, gout, septic joint, hemarthrosis, vascular injury, ischemia, compartment syndrome, posterior tibial traumatic injury, Morel Lavallee lesion, tendon, ligament injury.  Will have follow-up outpatient, return for new or worsening symptoms  The patient has been appropriately medically screened and/or stabilized in the ED. I have low suspicion for any other emergent medical condition which would require further screening, evaluation or treatment in the ED or require inpatient management.  Patient is hemodynamically stable and in no acute distress.  Patient able to ambulate in department prior to ED.  Evaluation does not show acute pathology that would require ongoing or additional emergent interventions while in the emergency department or further inpatient treatment.  I have discussed the diagnosis with the patient and answered all questions.  Pain is been managed while in the  emergency department and patient has no further complaints prior to discharge.  Patient is comfortable with plan discussed in room and is stable for discharge at this time.  I have discussed strict return precautions for returning to the emergency department.  Patient was encouraged to follow-up with PCP/specialist refer to at discharge.                                 Medical Decision Making Amount and/or Complexity of Data  Reviewed External Data Reviewed: labs, radiology and notes. Radiology: ordered and independent interpretation performed. Decision-making details documented in ED Course.  Risk OTC drugs. Prescription drug management. Decision regarding hospitalization. Diagnosis or treatment significantly limited by social determinants of health.          Final Clinical Impression(s) / ED Diagnoses Final diagnoses:  Bruise  Injury of left lower extremity, initial encounter    Rx / DC Orders ED Discharge Orders     None         Suliman Termini A, PA-C 07/02/23 1640    Trish Furl, MD 07/03/23 [phone]

## 2023-10-29 IMAGING — CT CT HEAD W/O CM
4 series · 17 of 47 positions shown, 19 images · non-contrast
Comparison: None.

CLINICAL DATA: Head trauma.  Headache.

EXAM:
CT HEAD WITHOUT CONTRAST
TECHNIQUE: Contiguous axial images were obtained from the base of the skull
through the vertex without intravenous contrast.

[Series 3: head wo · axial · 0.39mm/px · z∈[-52,+73]mm · 7 of 35 slices shown, 9 images]
[im 5/35  brain]
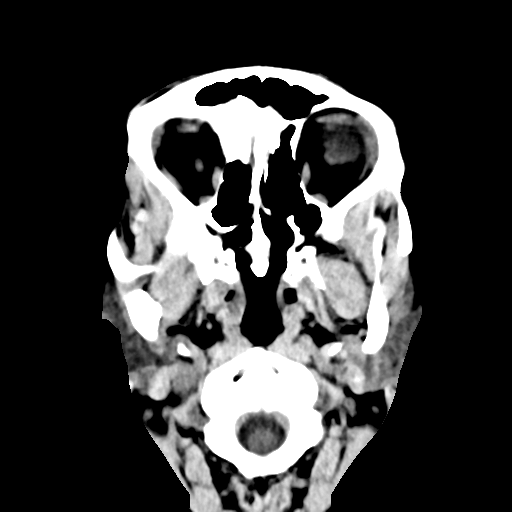
[im 5/35  bone]
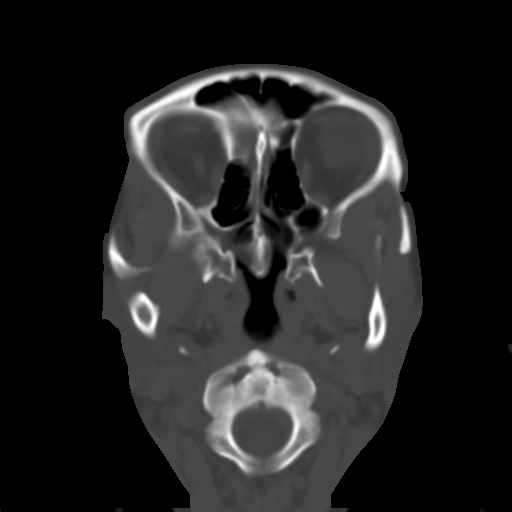
[im 9/35  brain]
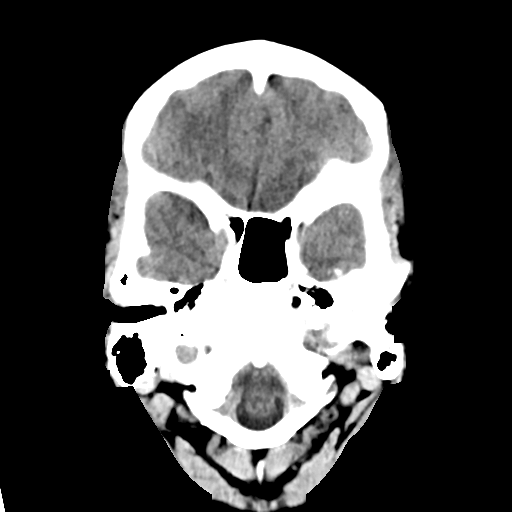
[im 13/35  brain]
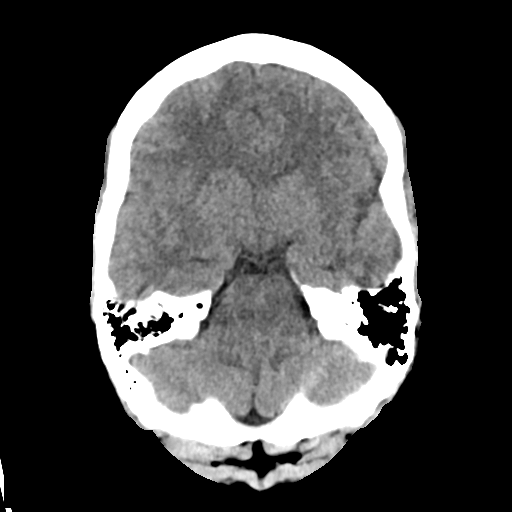
[im 18/35  brain]
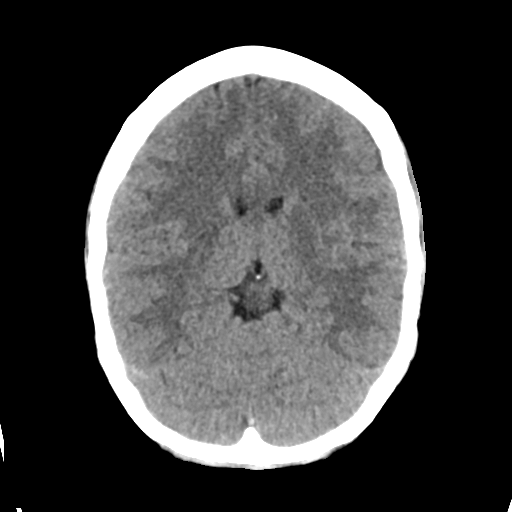
[im 22/35  brain]
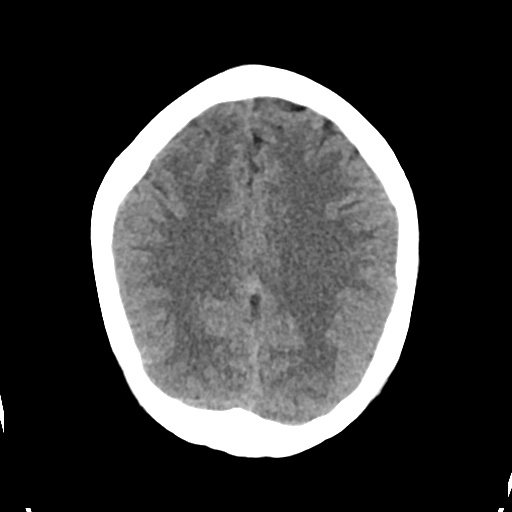
[im 22/35  bone]
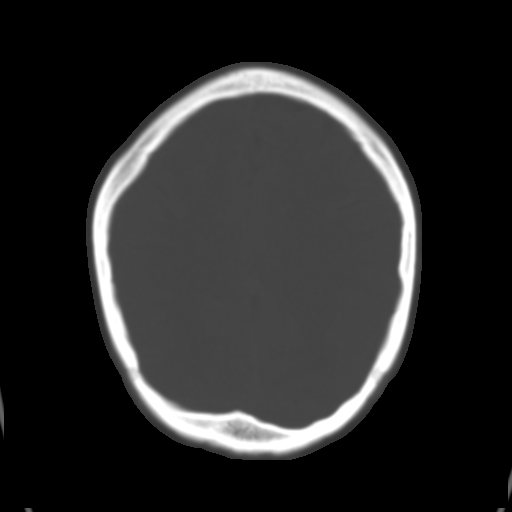
[im 26/35  brain]
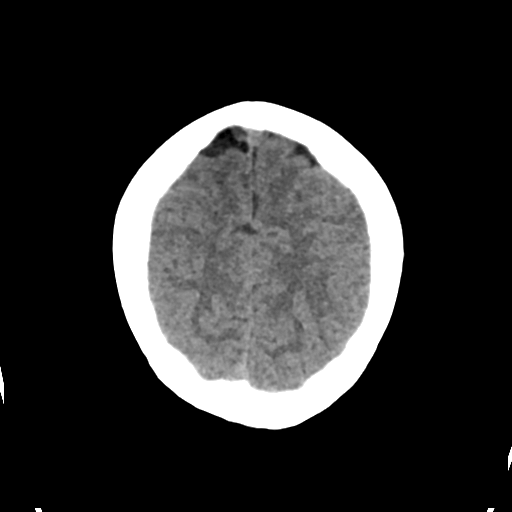
[im 30/35  brain]
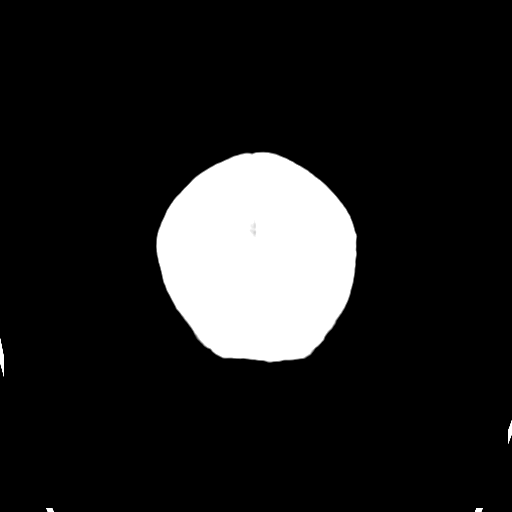

[Series 4: head bone · axial · 0.39mm/px · z∈[-56,+6]mm · 4 of 88 slices shown]
[im 9/88  bone]
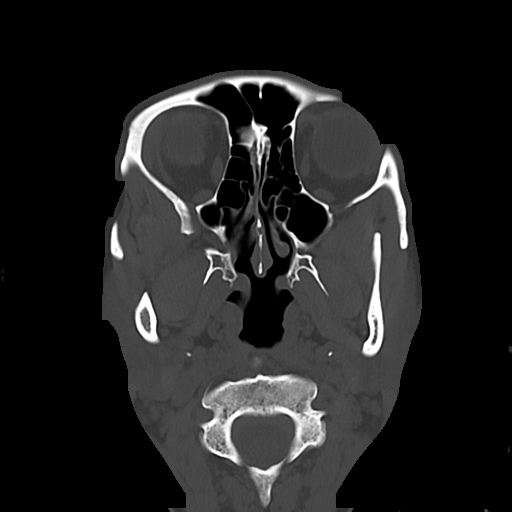
[im 18/88  bone]
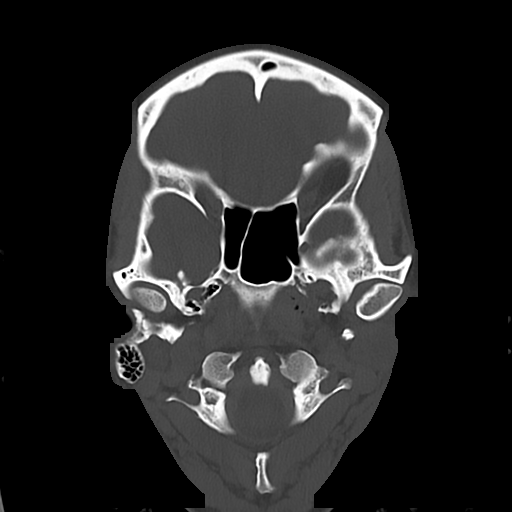
[im 27/88  bone]
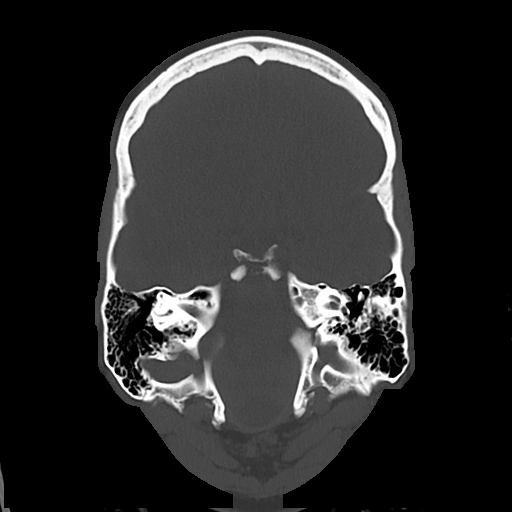
[im 40/88  bone]
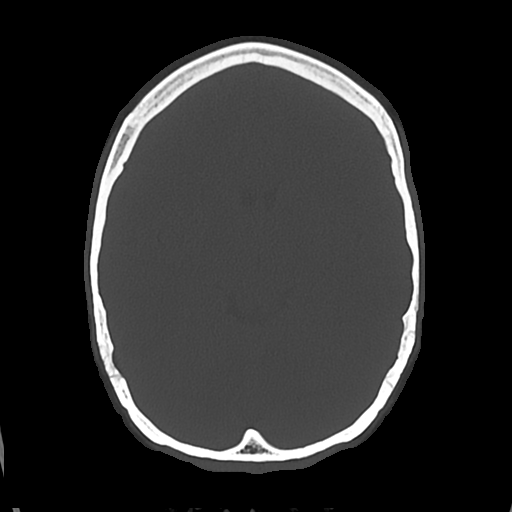

[Series 5: cor soft · coronal · 0.30mm/px · 3 of 63 slices shown]
[im 22/63  brain]
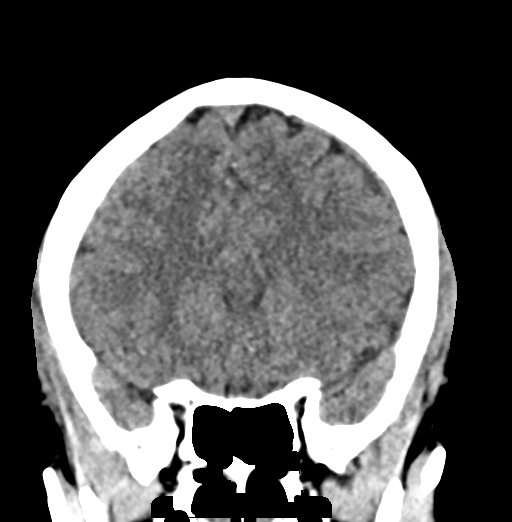
[im 28/63  brain]
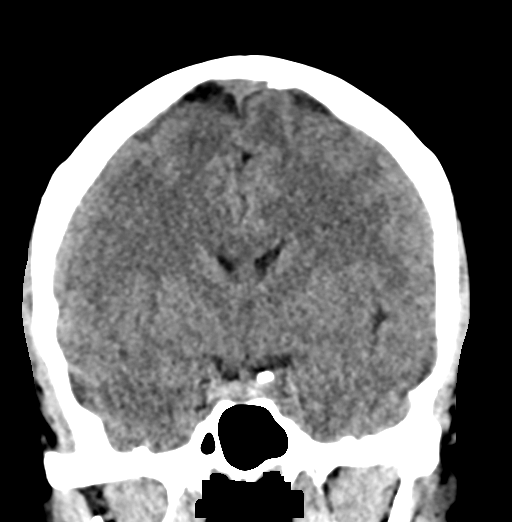
[im 35/63  brain]
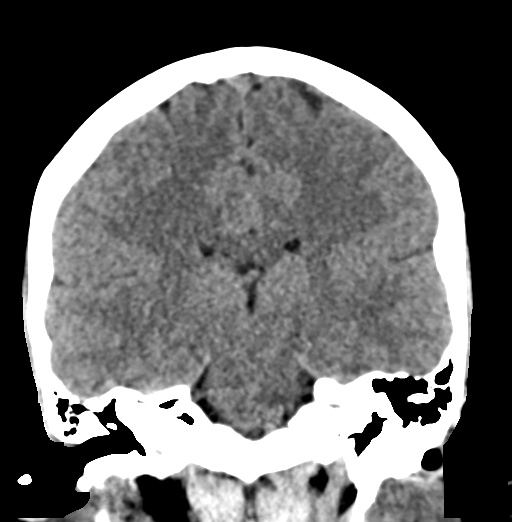

[Series 6: sag soft · sagittal · 0.30mm/px · 3 of 51 slices shown]
[im 17/51  brain]
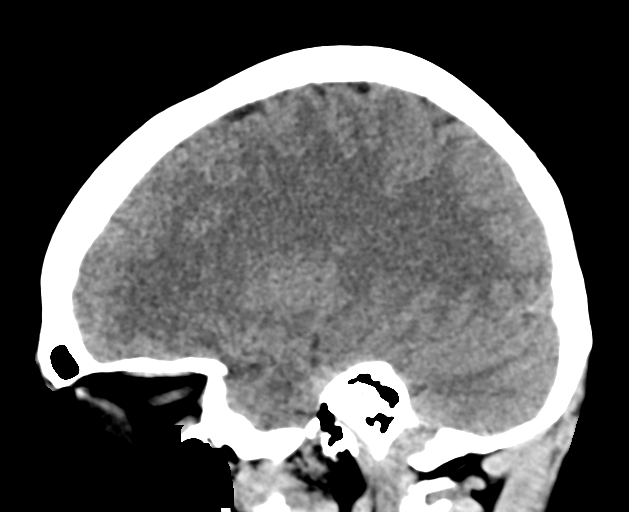
[im 26/51  brain]
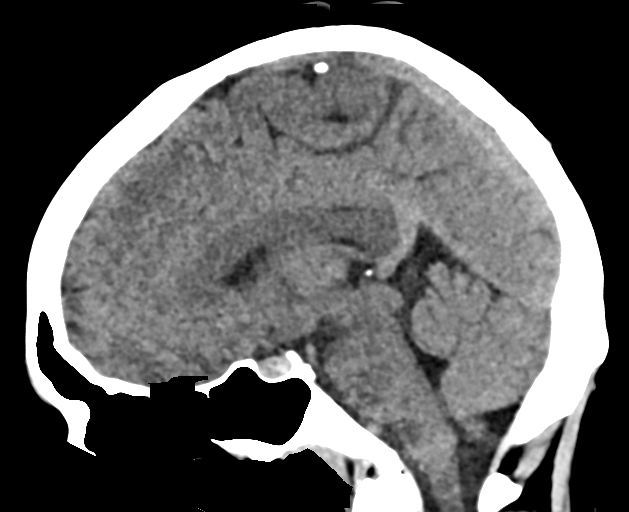
[im 34/51  brain]
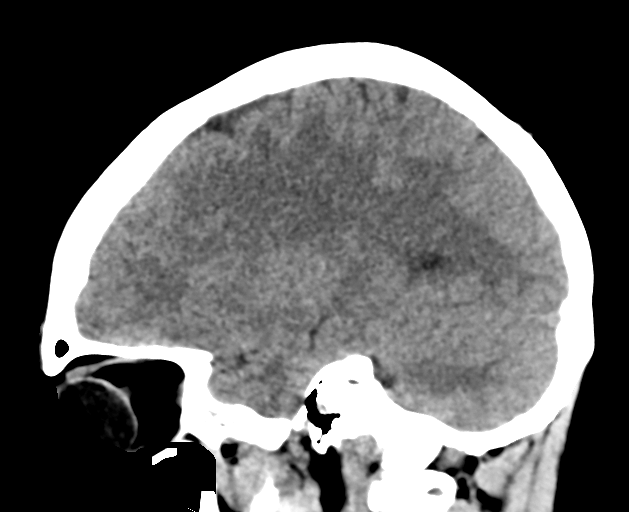

[17 of 47 positions shown; findings below may reference images not displayed]

FINDINGS: Brain: No evidence of acute infarction, hemorrhage, hydrocephalus,
extra-axial collection or mass lesion/mass effect.

Vascular: No hyperdense vessel or unexpected calcification.

Skull: Normal. Negative for fracture or focal lesion.

Sinuses/Orbits: Visualized sinuses are aerated. There may be mild
mucosal thickening in the sinuses.

Other: None.
IMPRESSION: Negative head CT.

## 2023-10-29 IMAGING — CT CT CERVICAL SPINE W/O CM
3 of 4 series · 11 of 33 positions shown, 13 images · non-contrast
Comparison: None.

CLINICAL DATA: Neck trauma.  Midline tenderness.

EXAM:
CT CERVICAL SPINE WITHOUT CONTRAST
TECHNIQUE: Multidetector CT imaging of the cervical spine was performed without
intravenous contrast. Multiplanar CT image reconstructions were also
generated.

[Series 7: sag bone · sagittal · 0.22mm/px · 5 of 50 slices shown, 6 images]
[im 17/50  bone]
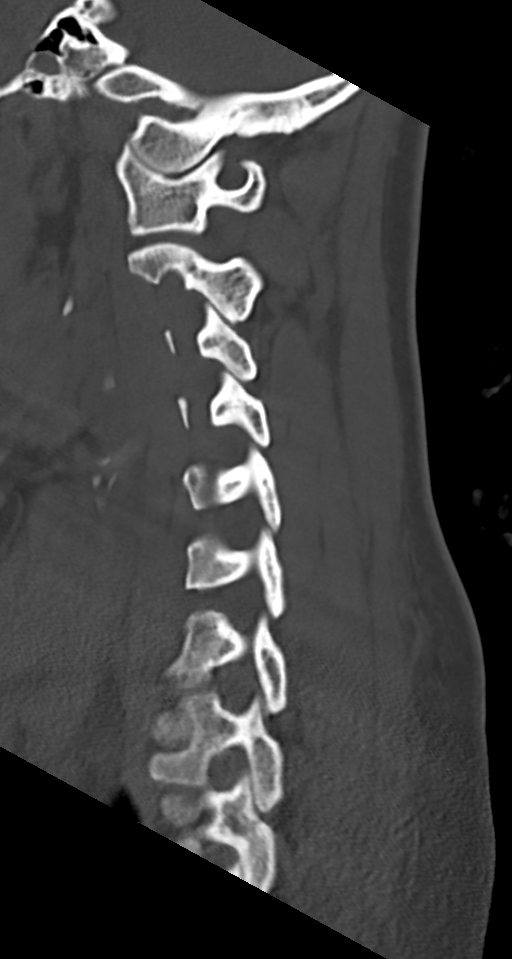
[im 21/50  bone]
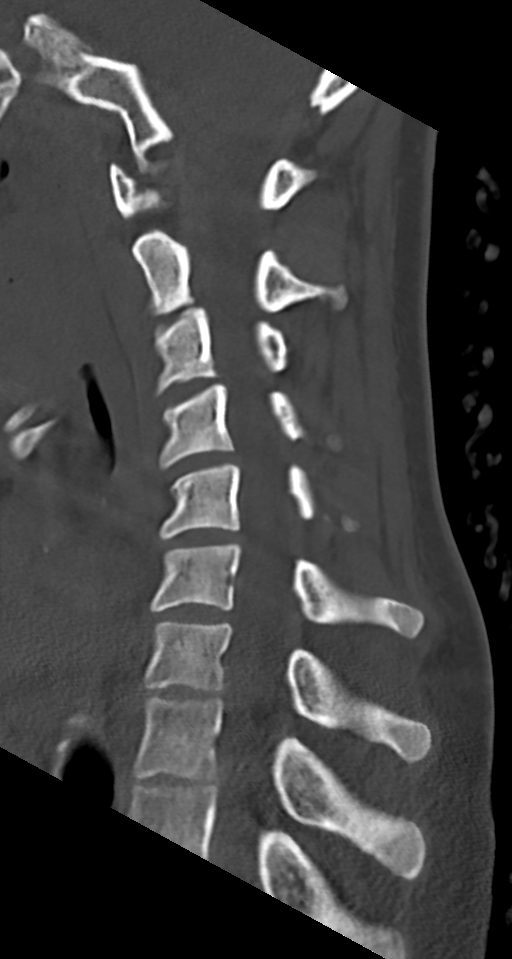
[im 25/50  soft-tissue]
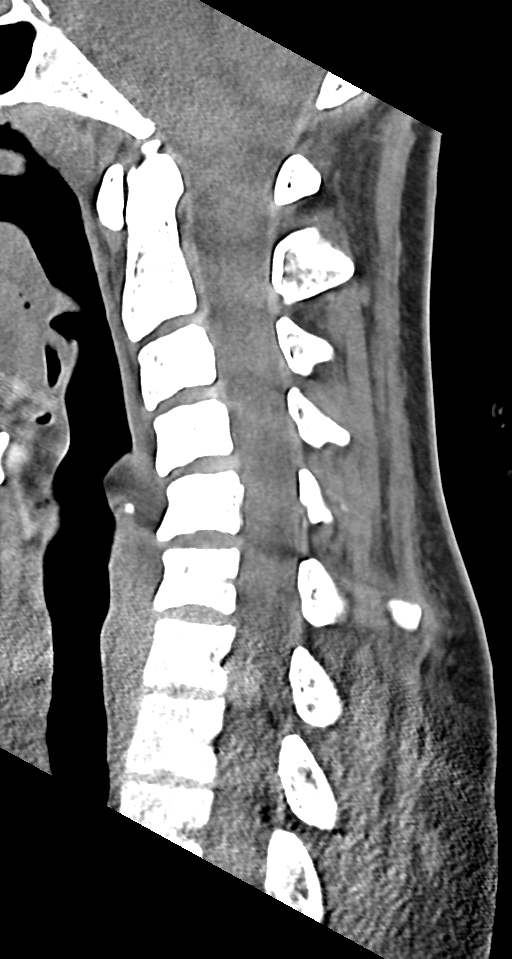
[im 25/50  bone]
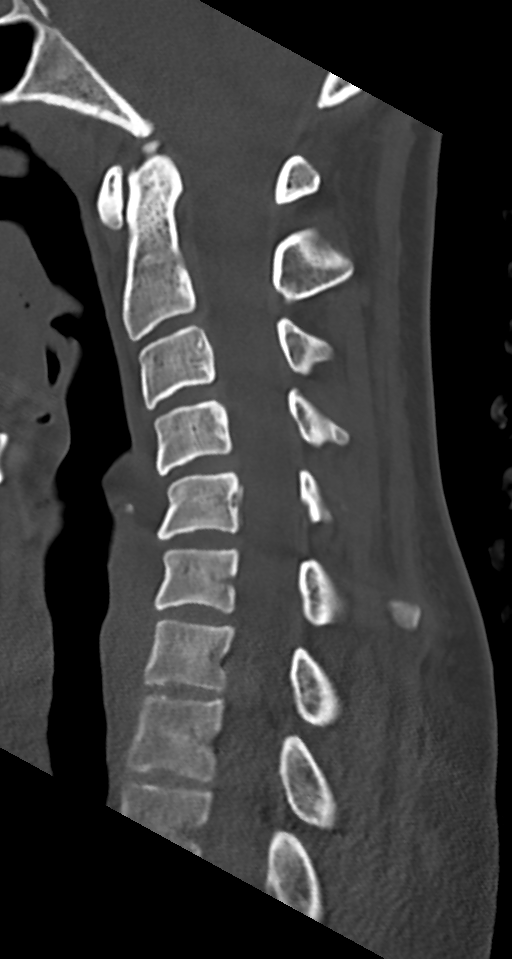
[im 29/50  bone]
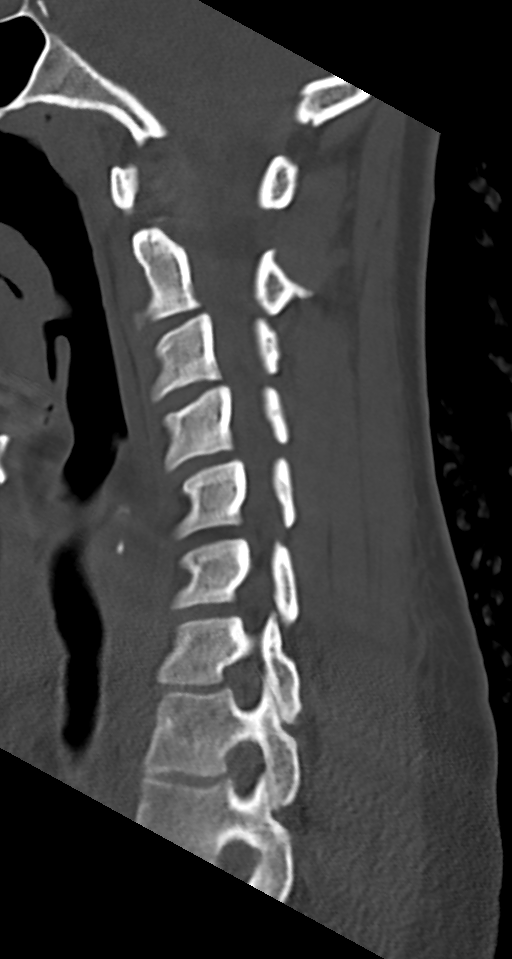
[im 33/50  bone]
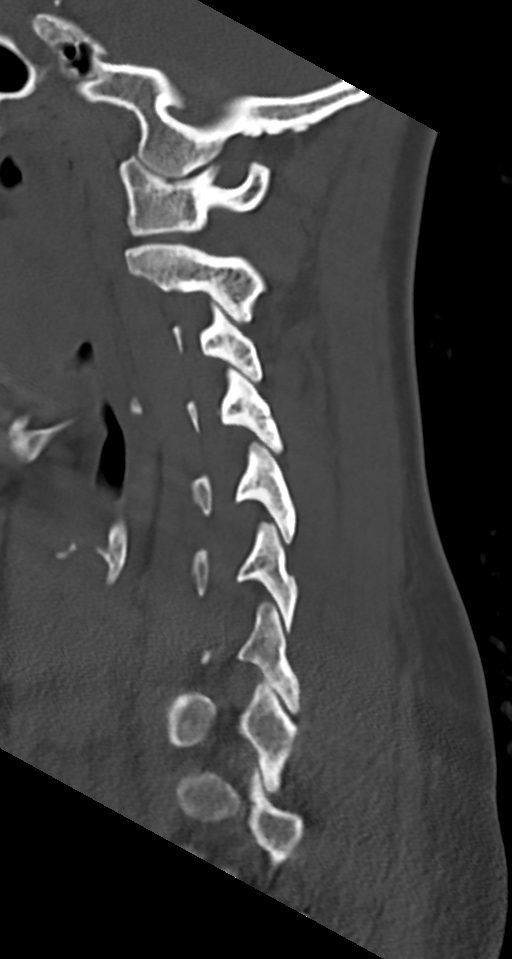

[Series 8: cor bone · coronal · 0.24mm/px · 3 of 72 slices shown]
[im 20/72  bone]
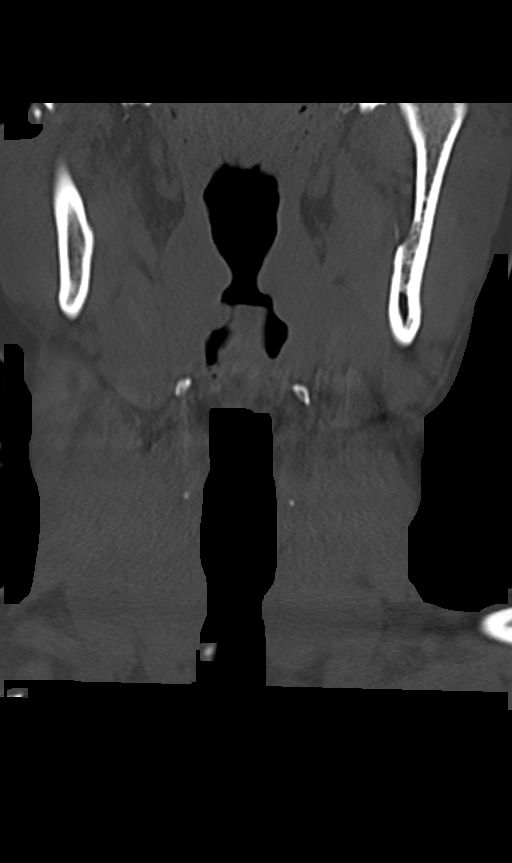
[im 31/72  bone]
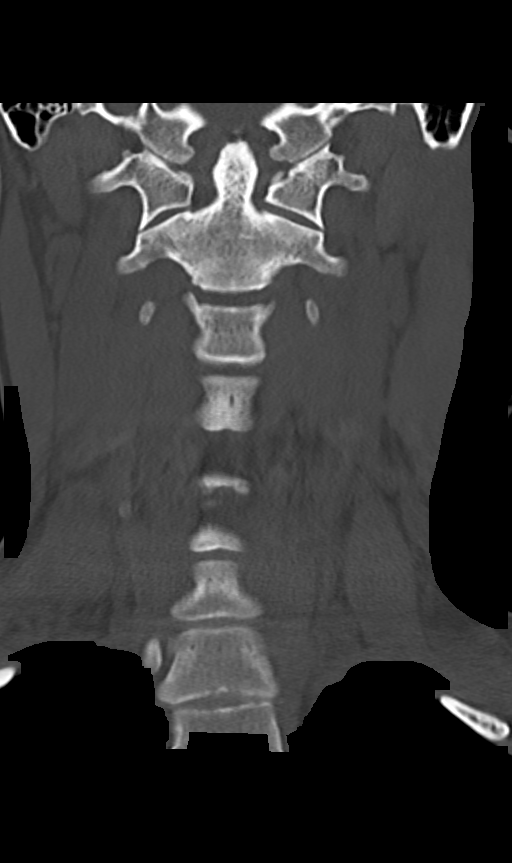
[im 41/72  bone]
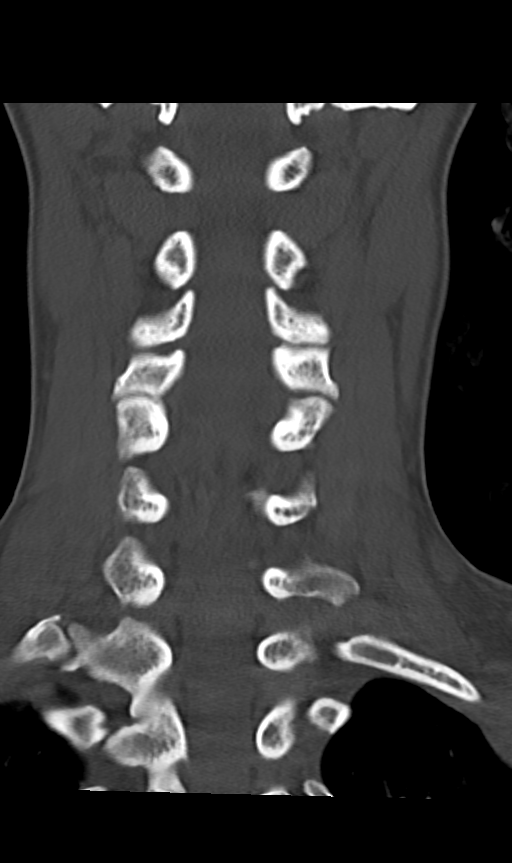

[Series 10: orthogonal axials · axial · 0.21mm/px · z∈[-152,-60]mm · 3 of 85 slices shown, 4 images]
[im 15/85  soft-tissue]
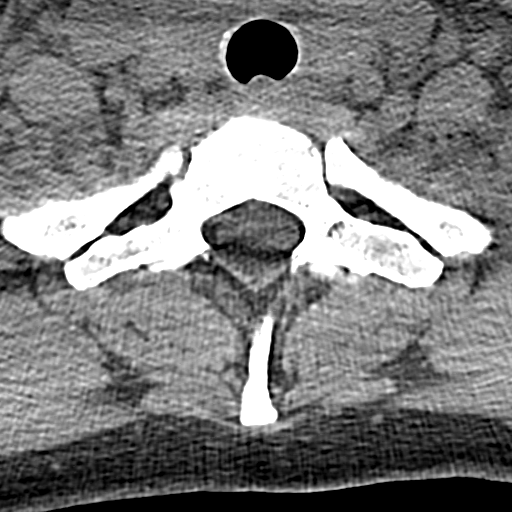
[im 15/85  bone]
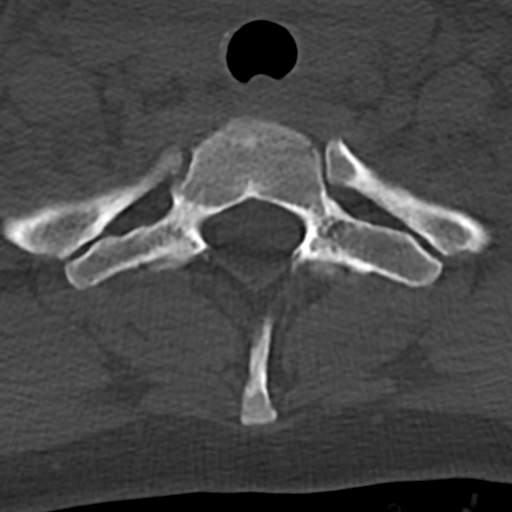
[im 43/85  bone]
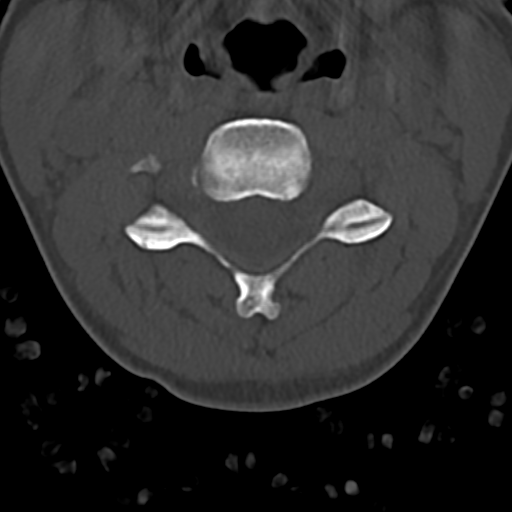
[im 71/85  bone]
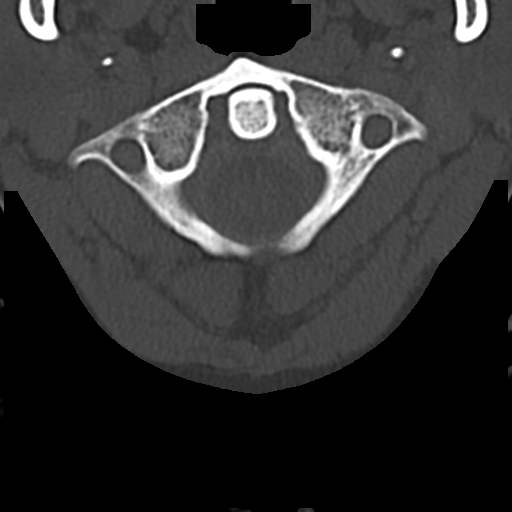

[11 of 33 positions shown; findings below may reference images not displayed]

FINDINGS: Alignment: Kyphosis in the cervical spine which is likely related to
patient positioning.

Skull base and vertebrae: No acute fracture. No primary bone lesion
or focal pathologic process.

Soft tissues and spinal canal: No prevertebral fluid or swelling. No
visible canal hematoma.

Disc levels:  Disc spaces are maintained.

Upper chest: Negative.

Other: None.
IMPRESSION: No acute bone abnormality in the cervical spine.
